# Patient Record
Sex: Male | Born: 1975 | Race: Black or African American | Hispanic: No | Marital: Married | Smoking: Former smoker
Health system: Southern US, Community
[De-identification: ages and names within clinical notes are randomized; demographics above are authoritative.]

## PROBLEM LIST (undated history)

## (undated) DIAGNOSIS — G44009 Cluster headache syndrome, unspecified, not intractable: Secondary | ICD-10-CM

## (undated) DIAGNOSIS — N529 Male erectile dysfunction, unspecified: Secondary | ICD-10-CM

## (undated) DIAGNOSIS — I10 Essential (primary) hypertension: Secondary | ICD-10-CM

## (undated) DIAGNOSIS — M79673 Pain in unspecified foot: Secondary | ICD-10-CM

## (undated) DIAGNOSIS — E785 Hyperlipidemia, unspecified: Secondary | ICD-10-CM

## (undated) DIAGNOSIS — R079 Chest pain, unspecified: Secondary | ICD-10-CM

## (undated) HISTORY — DX: Cluster headache syndrome, unspecified, not intractable: G44.009

## (undated) HISTORY — DX: Male erectile dysfunction, unspecified: N52.9

## (undated) HISTORY — DX: Pain in unspecified foot: M79.673

## (undated) HISTORY — DX: Chest pain, unspecified: R07.9

## (undated) HISTORY — DX: Hyperlipidemia, unspecified: E78.5

---

## 1999-09-30 ENCOUNTER — Encounter: Payer: Self-pay | Admitting: Emergency Medicine

## 1999-09-30 ENCOUNTER — Emergency Department (HOSPITAL_COMMUNITY): Admission: EM | Admit: 1999-09-30 | Discharge: 1999-09-30 | Payer: Self-pay | Admitting: Emergency Medicine

## 2002-09-18 ENCOUNTER — Emergency Department (HOSPITAL_COMMUNITY): Admission: EM | Admit: 2002-09-18 | Discharge: 2002-09-19 | Payer: Self-pay | Admitting: Emergency Medicine

## 2005-11-11 ENCOUNTER — Emergency Department (HOSPITAL_COMMUNITY): Admission: EM | Admit: 2005-11-11 | Discharge: 2005-11-11 | Payer: Self-pay | Admitting: Family Medicine

## 2007-01-19 ENCOUNTER — Emergency Department (HOSPITAL_COMMUNITY): Admission: EM | Admit: 2007-01-19 | Discharge: 2007-01-19 | Payer: Self-pay | Admitting: Emergency Medicine

## 2008-01-17 ENCOUNTER — Emergency Department (HOSPITAL_COMMUNITY): Admission: EM | Admit: 2008-01-17 | Discharge: 2008-01-17 | Payer: Self-pay | Admitting: Emergency Medicine

## 2008-01-28 ENCOUNTER — Emergency Department (HOSPITAL_COMMUNITY): Admission: EM | Admit: 2008-01-28 | Discharge: 2008-01-29 | Payer: Self-pay | Admitting: Emergency Medicine

## 2010-03-27 ENCOUNTER — Emergency Department (HOSPITAL_COMMUNITY): Admission: EM | Admit: 2010-03-27 | Discharge: 2010-03-27 | Payer: Self-pay | Admitting: Emergency Medicine

## 2010-08-06 ENCOUNTER — Emergency Department (HOSPITAL_COMMUNITY)
Admission: EM | Admit: 2010-08-06 | Discharge: 2010-08-06 | Payer: Self-pay | Source: Home / Self Care | Admitting: Emergency Medicine

## 2010-11-23 ENCOUNTER — Emergency Department (HOSPITAL_COMMUNITY)
Admission: EM | Admit: 2010-11-23 | Discharge: 2010-11-24 | Disposition: A | Discharge status: Home / Self Care | Payer: Self-pay | Attending: Emergency Medicine | Admitting: Emergency Medicine

## 2010-11-23 DIAGNOSIS — R109 Unspecified abdominal pain: Secondary | ICD-10-CM | POA: Insufficient documentation

## 2010-11-23 DIAGNOSIS — R6883 Chills (without fever): Secondary | ICD-10-CM | POA: Insufficient documentation

## 2010-11-23 DIAGNOSIS — R197 Diarrhea, unspecified: Secondary | ICD-10-CM | POA: Insufficient documentation

## 2010-11-23 DIAGNOSIS — R110 Nausea: Secondary | ICD-10-CM | POA: Insufficient documentation

## 2010-11-23 LAB — URINALYSIS, ROUTINE W REFLEX MICROSCOPIC
Glucose, UA: NEGATIVE mg/dL
Hgb urine dipstick: NEGATIVE
Specific Gravity, Urine: 1.029 (ref 1.005–1.030)
pH: 5 (ref 5.0–8.0)

## 2010-11-24 ENCOUNTER — Emergency Department (HOSPITAL_COMMUNITY): Payer: Self-pay

## 2010-11-24 LAB — COMPREHENSIVE METABOLIC PANEL
ALT: <8 U/L (ref 0–53)
AST: 12 U/L (ref 0–37)
CO2: 24 mEq/L (ref 19–32)
Chloride: 111 mEq/L (ref 96–112)
Creatinine, Ser: 0.94 mg/dL (ref 0.4–1.5)
GFR calc Af Amer: >60 mL/min (ref >60)
GFR calc non Af Amer: >60 mL/min (ref >60)
Total Bilirubin: 1 mg/dL (ref 0.3–1.2)

## 2010-11-24 LAB — CBC
HCT: 44 % (ref 39.0–52.0)
Hemoglobin: 14.8 g/dL (ref 13.0–17.0)
MCH: 29.8 pg (ref 26.0–34.0)
MCHC: 33.6 g/dL (ref 30.0–36.0)
MCV: 88.5 fL (ref 78.0–100.0)
Platelets: 270 K/uL (ref 150–400)
RBC: 4.97 MIL/uL (ref 4.22–5.81)
RDW: 14.7 % (ref 11.5–15.5)
WBC: 6.1 K/uL (ref 4.0–10.5)

## 2010-11-24 LAB — DIFFERENTIAL
Basophils Absolute: 0 K/uL (ref 0.0–0.1)
Basophils Relative: 0 % (ref 0–1)
Eosinophils Absolute: 0.1 K/uL (ref 0.0–0.7)
Eosinophils Relative: 1 % (ref 0–5)
Lymphocytes Relative: 27 % (ref 12–46)
Lymphs Abs: 1.7 10*3/uL (ref 0.7–4.0)
Monocytes Absolute: 0.4 K/uL (ref 0.1–1.0)
Monocytes Relative: 6 % (ref 3–12)
Neutro Abs: 4 10*3/uL (ref 1.7–7.7)
Neutrophils Relative %: 65 % (ref 43–77)

## 2010-11-24 LAB — COMPREHENSIVE METABOLIC PANEL WITH GFR
Albumin: 3.9 g/dL (ref 3.5–5.2)
Alkaline Phosphatase: 65 U/L (ref 39–117)
BUN: 9 mg/dL (ref 6–23)
Calcium: 9.1 mg/dL (ref 8.4–10.5)
Glucose, Bld: 98 mg/dL (ref 70–99)
Potassium: 4.1 meq/L (ref 3.5–5.1)
Sodium: 139 meq/L (ref 135–145)
Total Protein: 6.9 g/dL (ref 6.0–8.3)

## 2010-11-24 LAB — LIPASE, BLOOD: Lipase: 23 U/L (ref 11–59)

## 2011-02-09 ENCOUNTER — Inpatient Hospital Stay (INDEPENDENT_AMBULATORY_CARE_PROVIDER_SITE_OTHER)
Admission: RE | Admit: 2011-02-09 | Discharge: 2011-02-09 | Disposition: A | Discharge status: Home / Self Care | Payer: Self-pay | Source: Ambulatory Visit | Attending: Family Medicine | Admitting: Family Medicine

## 2011-02-09 DIAGNOSIS — T148 Other injury of unspecified body region: Secondary | ICD-10-CM

## 2011-03-15 ENCOUNTER — Emergency Department (HOSPITAL_COMMUNITY): Payer: Self-pay

## 2011-03-15 ENCOUNTER — Emergency Department (HOSPITAL_COMMUNITY)
Admission: EM | Admit: 2011-03-15 | Discharge: 2011-03-15 | Disposition: A | Discharge status: Home / Self Care | Payer: Self-pay | Attending: Emergency Medicine | Admitting: Emergency Medicine

## 2011-03-15 DIAGNOSIS — R0789 Other chest pain: Secondary | ICD-10-CM | POA: Insufficient documentation

## 2011-03-15 DIAGNOSIS — R071 Chest pain on breathing: Secondary | ICD-10-CM | POA: Insufficient documentation

## 2011-03-15 DIAGNOSIS — R112 Nausea with vomiting, unspecified: Secondary | ICD-10-CM | POA: Insufficient documentation

## 2011-03-15 DIAGNOSIS — R197 Diarrhea, unspecified: Secondary | ICD-10-CM | POA: Insufficient documentation

## 2011-03-15 LAB — POCT I-STAT, CHEM 8
BUN: 10 mg/dL (ref 6–23)
Calcium, Ion: 1.22 mmol/L (ref 1.12–1.32)
Creatinine, Ser: 1.2 mg/dL (ref 0.50–1.35)
TCO2: 26 mmol/L (ref 0–100)

## 2011-03-15 LAB — POCT I-STAT TROPONIN I

## 2011-11-02 ENCOUNTER — Emergency Department (HOSPITAL_COMMUNITY)
Admission: EM | Admit: 2011-11-02 | Discharge: 2011-11-03 | Disposition: A | Discharge status: Home / Self Care | Payer: Self-pay | Attending: Emergency Medicine | Admitting: Emergency Medicine

## 2011-11-02 ENCOUNTER — Encounter (HOSPITAL_COMMUNITY): Payer: Self-pay | Admitting: Emergency Medicine

## 2011-11-02 DIAGNOSIS — K529 Noninfective gastroenteritis and colitis, unspecified: Secondary | ICD-10-CM

## 2011-11-02 DIAGNOSIS — K5289 Other specified noninfective gastroenteritis and colitis: Secondary | ICD-10-CM | POA: Insufficient documentation

## 2011-11-02 DIAGNOSIS — K219 Gastro-esophageal reflux disease without esophagitis: Secondary | ICD-10-CM | POA: Insufficient documentation

## 2011-11-02 LAB — CBC
Hemoglobin: 14.7 g/dL (ref 13.0–17.0)
MCH: 31.3 pg (ref 26.0–34.0)
MCV: 89.8 fL (ref 78.0–100.0)
RBC: 4.69 MIL/uL (ref 4.22–5.81)

## 2011-11-02 LAB — BASIC METABOLIC PANEL
BUN: 7 mg/dL (ref 6–23)
Calcium: 9 mg/dL (ref 8.4–10.5)
GFR calc non Af Amer: >90 mL/min (ref >90)
Glucose, Bld: 89 mg/dL (ref 70–99)
Potassium: 3.8 mEq/L (ref 3.5–5.1)

## 2011-11-02 LAB — DIFFERENTIAL
Eosinophils Absolute: 0.2 10*3/uL (ref 0.0–0.7)
Eosinophils Relative: 3 % (ref 0–5)
Lymphs Abs: 2.8 10*3/uL (ref 0.7–4.0)
Monocytes Relative: 6 % (ref 3–12)

## 2011-11-02 NOTE — ED Notes (Signed)
PT. REPORTS VOMITTING AND DIARRHEA WITH MID ABDOMINAL CRAMPING ONSET LAST Saturday , DENIES FEVER OR CHILLS.

## 2011-11-03 LAB — URINALYSIS, ROUTINE W REFLEX MICROSCOPIC
Hgb urine dipstick: NEGATIVE
Specific Gravity, Urine: 1.028 (ref 1.005–1.030)
pH: 6 (ref 5.0–8.0)

## 2011-11-03 MED ORDER — PANTOPRAZOLE SODIUM 40 MG PO TBEC: 40.0000 mg | DELAYED_RELEASE_TABLET | Freq: Every day | ORAL | Status: DC

## 2011-11-03 MED ORDER — PANTOPRAZOLE SODIUM 40 MG PO TBEC
40.0000 mg | DELAYED_RELEASE_TABLET | Freq: Once | ORAL | Status: AC
  Administered 2011-11-03: 40 mg via ORAL
  Filled 2011-11-03: qty 1

## 2011-11-03 MED ORDER — GI COCKTAIL ~~LOC~~
30.0000 mL | Freq: Once | ORAL | Status: AC
  Administered 2011-11-03: 30 mL via ORAL
  Filled 2011-11-03: qty 30

## 2011-11-03 NOTE — ED Provider Notes (Signed)
History     CSN: [id number]  Arrival date & time 11/02/11  2218   First MD Initiated Contact with Patient 11/03/11 0145      Chief Complaint  Patient presents with  . Emesis    (Consider location/radiation/quality/duration/timing/severity/associated sxs/prior treatment) Patient is a 36 y.o. male presenting with vomiting. The history is provided by the patient.  Emesis   He started getting sick 2 and half days ago with nausea and vomiting and diarrhea. There is associated mild epigastric cramping which did improve after he vomited or had a bowel movement. He had chills and sweats without any fever. He vomited once 2 days ago and once yesterday. Today he is not having any further nausea. He's only had one bowel movement today and no longer has a sense that he is going to have more diarrhea. He is complaining of some ongoing burning and cramping in his epigastric area without radiation. He has taken antacids on a couple of occasions with temporary relief. He has had sick contacts who had similar stomach, earlier in the week. Symptoms are described as moderate to severe.  History reviewed. No pertinent past medical history.  History reviewed. No pertinent past surgical history.  No family history on file.  History  Substance Use Topics  . Smoking status: Never Smoker   . Smokeless tobacco: Not on file  . Alcohol Use: Yes      Review of Systems  Gastrointestinal: Positive for vomiting.  All other systems reviewed and are negative.    Allergies  Review of patient's allergies indicates no known allergies.  Home Medications   Current Outpatient Rx  Name Route Sig Dispense Refill  . OVER THE COUNTER MEDICATION Oral Take 15 mLs by mouth daily as needed. For upset stomach (Generic Antacid)      BP 132/74  Pulse 81  Temp(Src) 98.6 F (37 C) (Oral)  Resp 17  SpO2 96%  Physical Exam  Nursing note and vitals reviewed.  36 year old male who is resting comfortably and in no  acute distress. Vital signs are normal. Oxygen saturation is 96% which is normal. Head is normocephalic and atraumatic. PERRLA, EOMI. Oropharynx is clear. Mucous members are moist. Neck is nontender and supple without adenopathy or JVD. Lungs are clear without rales, wheezes, rhonchi. Heart has regular rate and rhythm without murmur. Abdomen is soft, flat, without masses. There is mild epigastric tenderness. There is no rebound or guarding. Peristalsis is diminished but present. Extremities have no cyanosis or edema, full range of motion is present. Skin is warm and dry without rash. Neurologic: Mental status is normal, cranial nerves are intact, there are no focal motor or sensory deficits.  ED Course  Procedures (including critical care time)  Results for orders placed during the hospital encounter of 11/02/11  URINALYSIS, ROUTINE W REFLEX MICROSCOPIC      Component Value Range   Color, Urine YELLOW  YELLOW    APPearance CLEAR  CLEAR    Specific Gravity, Urine 1.028  1.005 - 1.030    pH 6.0  5.0 - 8.0    Glucose, UA NEGATIVE  NEGATIVE (mg/dL)   Hgb urine dipstick NEGATIVE  NEGATIVE    Bilirubin Urine SMALL (*) NEGATIVE    Ketones, ur 15 (*) NEGATIVE (mg/dL)   Protein, ur NEGATIVE  NEGATIVE (mg/dL)   Urobilinogen, UA 1.0  0.0 - 1.0 (mg/dL)   Nitrite NEGATIVE  NEGATIVE    Leukocytes, UA NEGATIVE  NEGATIVE   CBC  Component Value Range   WBC 5.9  4.0 - 10.5 (K/uL)   RBC 4.69  4.22 - 5.81 (MIL/uL)   Hemoglobin 14.7  13.0 - 17.0 (g/dL)   HCT 16.1  09.6 - 04.5 (%)   MCV 89.8  78.0 - 100.0 (fL)   MCH 31.3  26.0 - 34.0 (pg)   MCHC 34.9  30.0 - 36.0 (g/dL)   RDW 40.9  81.1 - 91.4 (%)   Platelets 242  150 - 400 (K/uL)  DIFFERENTIAL      Component Value Range   Neutrophils Relative 43  43 - 77 (%)   Neutro Abs 2.5  1.7 - 7.7 (K/uL)   Lymphocytes Relative 47 (*) 12 - 46 (%)   Lymphs Abs 2.8  0.7 - 4.0 (K/uL)   Monocytes Relative 6  3 - 12 (%)   Monocytes Absolute 0.4  0.1 - 1.0 (K/uL)     Eosinophils Relative 3  0 - 5 (%)   Eosinophils Absolute 0.2  0.0 - 0.7 (K/uL)   Basophils Relative 1  0 - 1 (%)   Basophils Absolute 0.0  0.0 - 0.1 (K/uL)  BASIC METABOLIC PANEL      Component Value Range   Sodium 142  135 - 145 (mEq/L)   Potassium 3.8  3.5 - 5.1 (mEq/L)   Chloride 106  96 - 112 (mEq/L)   CO2 27  19 - 32 (mEq/L)   Glucose, Bld 89  70 - 99 (mg/dL)   BUN 7  6 - 23 (mg/dL)   Creatinine, Ser 7.82  0.50 - 1.35 (mg/dL)   Calcium 9.0  8.4 - 95.6 (mg/dL)   GFR calc non Af Amer >90  >90 (mL/min)   GFR calc Af Amer >90  >90 (mL/min)   He feels much better after a GI cocktail and oral Protonix. He'll be sent home with a prescription for Protonix and instructed use antacids as needed.  1. Gastroenteritis   2. GERD (gastroesophageal reflux disease)       MDM  Oral gastroenteritis. He is very much over the nausea and diarrhea component. Right now he is having dyspepsia and will be given a therapeutic trial of a GI cocktail and proton pump inhibitor.        Dione Booze, MD 11/03/11 [phone]

## 2011-11-03 NOTE — Discharge Instructions (Signed)
Gastroesophageal Reflux Disease, Adult Gastroesophageal reflux disease (GERD) happens when acid from your stomach flows up into the esophagus. When acid comes in contact with the esophagus, the acid causes soreness (inflammation) in the esophagus. Over time, GERD may create small holes (ulcers) in the lining of the esophagus. CAUSES   Increased body weight. This puts pressure on the stomach, making acid rise from the stomach into the esophagus.   Smoking. This increases acid production in the stomach.   Drinking alcohol. This causes decreased pressure in the lower esophageal sphincter (valve or ring of muscle between the esophagus and stomach), allowing acid from the stomach into the esophagus.   Late evening meals and a full stomach. This increases pressure and acid production in the stomach.   A malformed lower esophageal sphincter.  Sometimes, no cause is found. SYMPTOMS   Burning pain in the lower part of the mid-chest behind the breastbone and in the mid-stomach area. This may occur twice a week or more often.   Trouble swallowing.   Sore throat.   Dry cough.   Asthma-like symptoms including chest tightness, shortness of breath, or wheezing.  DIAGNOSIS  Your caregiver may be able to diagnose GERD based on your symptoms. In some cases, X-rays and other tests may be done to check for complications or to check the condition of your stomach and esophagus. TREATMENT  Your caregiver may recommend over-the-counter or prescription medicines to help decrease acid production. Ask your caregiver before starting or adding any new medicines.  HOME CARE INSTRUCTIONS   Change the factors that you can control. Ask your caregiver for guidance concerning weight loss, quitting smoking, and alcohol consumption.   Avoid foods and drinks that make your symptoms worse, such as:   Caffeine or alcoholic drinks.   Chocolate.   Peppermint or mint flavorings.   Garlic and onions.   Spicy foods.     Citrus fruits, such as oranges, lemons, or limes.   Tomato-based foods such as sauce, chili, salsa, and pizza.   Fried and fatty foods.   Avoid lying down for the 3 hours prior to your bedtime or prior to taking a nap.   Eat small, frequent meals instead of large meals.   Wear loose-fitting clothing. Do not wear anything tight around your waist that causes pressure on your stomach.   Raise the head of your bed 6 to 8 inches with wood blocks to help you sleep. Extra pillows will not help.   Only take over-the-counter or prescription medicines for pain, discomfort, or fever as directed by your caregiver.   Do not take aspirin, ibuprofen, or other nonsteroidal anti-inflammatory drugs (NSAIDs).  SEEK IMMEDIATE MEDICAL CARE IF:   You have pain in your arms, neck, jaw, teeth, or back.   Your pain increases or changes in intensity or duration.   You develop nausea, vomiting, or sweating (diaphoresis).   You develop shortness of breath, or you faint.   Your vomit is green, yellow, black, or looks like coffee grounds or blood.   Your stool is red, bloody, or black.  These symptoms could be signs of other problems, such as heart disease, gastric bleeding, or esophageal bleeding. MAKE SURE YOU:   Understand these instructions.   Will watch your condition.   Will get help right away if you are not doing well or get worse.  Document Released: 04/29/2005 Document Revised: 07/09/2011 Document Reviewed: 02/06/2011 [facility] Patient Information 2012 [place], [place].  Pantoprazole tablets What is this medicine? PANTOPRAZOLE (pan  TOE pra zole) prevents the production of acid in the stomach. It is used to treat gastroesophageal reflux disease (GERD), inflammation of the esophagus, and Zollinger-Ellison syndrome. This medicine may be used for other purposes; ask your health care provider or pharmacist if you have questions. What should I tell my health care provider before I take this  medicine? They need to know if you have any of these conditions: -liver disease -low levels of magnesium in the blood -an unusual or allergic reaction to omeprazole, lansoprazole, pantoprazole, rabeprazole, other medicines, foods, dyes, or preservatives -pregnant or trying to get pregnant -breast-feeding How should I use this medicine? Take this medicine by mouth. Swallow the tablets whole with a drink of water. Follow the directions on the prescription label. Do not crush, break, or chew. Take your medicine at regular intervals. Do not take your medicine more often than directed. Talk to your pediatrician regarding the use of this medicine in children. While this drug may be prescribed for children as young as 5 years for selected conditions, precautions do apply. Overdosage: If you think you have taken too much of this medicine contact a poison control center or emergency room at once. NOTE: This medicine is only for you. Do not share this medicine with others. What if I miss a dose? If you miss a dose, take it as soon as you can. If it is almost time for your next dose, take only that dose. Do not take double or extra doses. What may interact with this medicine? Do not take this medicine with any of the following medications: -atazanavir -nelfinavir This medicine may also interact with the following medications: -ampicillin -delavirdine -digoxin -diuretics -iron salts -medicines for fungal infections like ketoconazole, itraconazole and voriconazole -warfarin This list may not describe all possible interactions. Give your health care provider a list of all the medicines, herbs, non-prescription drugs, or dietary supplements you use. Also tell them if you smoke, drink alcohol, or use illegal drugs. Some items may interact with your medicine. What should I watch for while using this medicine? It can take several days before your stomach pain gets better. Check with your doctor or health  care professional if your condition does not start to get better, or if it gets worse. You may need blood work done while you are taking this medicine. What side effects may I notice from receiving this medicine? Side effects that you should report to your doctor or health care professional as soon as possible: -allergic reactions like skin rash, itching or hives, swelling of the face, lips, or tongue -bone, muscle or joint pain -breathing problems -chest pain or chest tightness -dark yellow or brown urine -dizziness -fast, irregular heartbeat -feeling faint or lightheaded -fever or sore throat -muscle spasm -palpitations -redness, blistering, peeling or loosening of the skin, including inside the mouth -seizures -tremors -unusual bleeding or bruising -unusually weak or tired -yellowing of the eyes or skin Side effects that usually do not require medical attention (Report these to your doctor or health care professional if they continue or are bothersome.): -constipation -diarrhea -dry mouth -headache -nausea This list may not describe all possible side effects. Call your doctor for medical advice about side effects. You may report side effects to FDA at 1-800-FDA-1088. Where should I keep my medicine? Keep out of the reach of children. Store at room temperature between 15 and 30 degrees C (59 and 86 degrees F). Protect from light and moisture. Throw away any unused medicine after the  expiration date. NOTE: This sheet is a summary. It may not cover all possible information. If you have questions about this medicine, talk to your doctor, pharmacist, or health care provider.  2012, [employer]. (10/08/2009 12:03:53 PM)

## 2012-08-24 ENCOUNTER — Emergency Department (HOSPITAL_COMMUNITY)
Admission: EM | Admit: 2012-08-24 | Discharge: 2012-08-24 | Disposition: A | Discharge status: Home / Self Care | Payer: Self-pay | Attending: Emergency Medicine | Admitting: Emergency Medicine

## 2012-08-24 ENCOUNTER — Encounter (HOSPITAL_COMMUNITY): Payer: Self-pay | Admitting: Emergency Medicine

## 2012-08-24 DIAGNOSIS — B349 Viral infection, unspecified: Secondary | ICD-10-CM

## 2012-08-24 DIAGNOSIS — M549 Dorsalgia, unspecified: Secondary | ICD-10-CM | POA: Insufficient documentation

## 2012-08-24 DIAGNOSIS — B9789 Other viral agents as the cause of diseases classified elsewhere: Secondary | ICD-10-CM | POA: Insufficient documentation

## 2012-08-24 DIAGNOSIS — Z87891 Personal history of nicotine dependence: Secondary | ICD-10-CM | POA: Insufficient documentation

## 2012-08-24 DIAGNOSIS — J329 Chronic sinusitis, unspecified: Secondary | ICD-10-CM | POA: Insufficient documentation

## 2012-08-24 DIAGNOSIS — R112 Nausea with vomiting, unspecified: Secondary | ICD-10-CM | POA: Insufficient documentation

## 2012-08-24 MED ORDER — LACTULOSE 10 GM/15ML PO SOLN: 30.0000 g | Freq: Once | ORAL | Status: DC

## 2012-08-24 MED ORDER — HYDROCODONE-ACETAMINOPHEN 5-325 MG PO TABS: 2.0000 | ORAL_TABLET | ORAL | Status: DC | PRN

## 2012-08-24 MED ORDER — MORPHINE SULFATE 4 MG/ML IJ SOLN
4.0000 mg | Freq: Once | INTRAMUSCULAR | Status: AC
  Administered 2012-08-24: 4 mg via INTRAMUSCULAR
  Filled 2012-08-24: qty 1

## 2012-08-24 MED ORDER — OSELTAMIVIR PHOSPHATE 75 MG PO CAPS: 75.0000 mg | ORAL_CAPSULE | Freq: Two times a day (BID) | ORAL | Status: DC

## 2012-08-24 MED ORDER — KETOROLAC TROMETHAMINE 30 MG/ML IJ SOLN
60.0000 mg | Freq: Once | INTRAMUSCULAR | Status: AC
  Administered 2012-08-24: 60 mg via INTRAMUSCULAR
  Filled 2012-08-24: qty 2

## 2012-08-24 MED ORDER — SULFAMETHOXAZOLE-TRIMETHOPRIM 800-160 MG PO TABS: 1.0000 | ORAL_TABLET | Freq: Two times a day (BID) | ORAL | Status: AC

## 2012-08-24 MED ORDER — NAPROXEN 375 MG PO TABS: 375.0000 mg | ORAL_TABLET | Freq: Two times a day (BID) | ORAL | Status: DC

## 2012-08-24 MED ORDER — ACETAMINOPHEN 325 MG PO TABS
650.0000 mg | ORAL_TABLET | Freq: Once | ORAL | Status: AC
  Administered 2012-08-24: 650 mg via ORAL
  Filled 2012-08-24: qty 2

## 2012-08-24 NOTE — ED Provider Notes (Signed)
History     CSN: [id number]  Arrival date & time 08/24/12  4098   First MD Initiated Contact with Patient 08/24/12 0542      Chief Complaint  Patient presents with  . Headache    patient states from sinus congestion  . Back Pain  . Nausea  . Emesis    (Consider location/radiation/quality/duration/timing/severity/associated sxs/prior treatment) Patient is a 37 y.o. male presenting with headaches, back pain, and vomiting. The history is provided by the patient.  Headache  This is a new problem. The current episode started yesterday. The problem occurs constantly. The problem has not changed since onset.The headache is associated with nothing. The pain is located in the bilateral region. The quality of the pain is described as dull. The pain is at a severity of 4/10. The pain is mild. Associated symptoms include vomiting.  Back Pain  Associated symptoms include headaches.  Emesis  Associated symptoms include headaches.    History reviewed. No pertinent past medical history.  History reviewed. No pertinent past surgical history.  No family history on file.  History  Substance Use Topics  . Smoking status: Former Games developer  . Smokeless tobacco: Not on file  . Alcohol Use: Yes      Review of Systems  Gastrointestinal: Positive for vomiting.  Musculoskeletal: Positive for back pain.  Neurological: Positive for headaches.  All other systems reviewed and are negative.    Allergies  Review of patient's allergies indicates no known allergies.  Home Medications   Current Outpatient Rx  Name  Route  Sig  Dispense  Refill  . OVER THE COUNTER MEDICATION   Oral   Take 15 mLs by mouth daily as needed. For upset stomach (Generic Antacid)         . PANTOPRAZOLE SODIUM 40 MG PO TBEC   Oral   Take 1 tablet (40 mg total) by mouth daily.   15 tablet   0     BP 136/81  Pulse 107  Temp 101.7 F (38.7 C) (Oral)  Resp 18  Ht 6' (1.829 m)  Wt 250 lb (113.399 kg)  BMI  33.91 kg/m2  SpO2 99%  Physical Exam  Constitutional: He is oriented to person, place, and time. He appears well-developed and well-nourished.  HENT:  Head: Normocephalic and atraumatic.  Eyes: Conjunctivae normal are normal. Pupils are equal, round, and reactive to light.  Neck: Normal range of motion. Neck supple. Spinous process tenderness and muscular tenderness present. No Brudzinski's sign and no Kernig's sign noted.  Cardiovascular: Normal rate, regular rhythm, normal heart sounds and intact distal pulses.   Pulmonary/Chest: Effort normal and breath sounds normal.  Abdominal: Soft. Bowel sounds are normal.  Neurological: He is alert and oriented to person, place, and time.  Skin: Skin is warm and dry.  Psychiatric: He has a normal mood and affect. His behavior is normal. Judgment and thought content normal.    ED Course  Procedures (including critical care time)  Labs Reviewed - No data to display No results found.   No diagnosis found.    MDM  mll viral syndrome.  No meningusmus.  Will analgesia,  Antiviral,  Dc to fu.  Ret new/worsneing sxs        Simaya Lumadue Lytle Michaels, MD 08/24/12 0600

## 2012-08-24 NOTE — ED Notes (Signed)
Patient claims started getting congested Monday night.  Patient claims that he started throwing up last night.   Patient claims that he has a bad headache with sinus congestion.  Patient complains of cough (dry).

## 2014-01-30 ENCOUNTER — Encounter (HOSPITAL_COMMUNITY): Payer: Self-pay | Admitting: Emergency Medicine

## 2014-01-30 ENCOUNTER — Emergency Department (INDEPENDENT_AMBULATORY_CARE_PROVIDER_SITE_OTHER)
Admission: EM | Admit: 2014-01-30 | Discharge: 2014-01-30 | Disposition: A | Discharge status: Home / Self Care | Payer: Self-pay | Source: Home / Self Care | Attending: Family Medicine | Admitting: Family Medicine

## 2014-01-30 DIAGNOSIS — K0889 Other specified disorders of teeth and supporting structures: Secondary | ICD-10-CM

## 2014-01-30 DIAGNOSIS — K089 Disorder of teeth and supporting structures, unspecified: Secondary | ICD-10-CM

## 2014-01-30 MED ORDER — IBUPROFEN 800 MG PO TABS
ORAL_TABLET | ORAL | Status: AC
  Filled 2014-01-30: qty 1

## 2014-01-30 MED ORDER — CLINDAMYCIN HCL 300 MG PO CAPS: 300.0000 mg | ORAL_CAPSULE | Freq: Three times a day (TID) | ORAL | Status: DC

## 2014-01-30 MED ORDER — IBUPROFEN 800 MG PO TABS
800.0000 mg | ORAL_TABLET | Freq: Once | ORAL | Status: AC
  Administered 2014-01-30: 800 mg via ORAL

## 2014-01-30 MED ORDER — DICLOFENAC POTASSIUM 50 MG PO TABS: 50.0000 mg | ORAL_TABLET | Freq: Three times a day (TID) | ORAL | Status: DC

## 2014-01-30 NOTE — Discharge Instructions (Signed)
Take medicine as prescribed, see your dentist as soon as possible °
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
## 2014-01-30 NOTE — ED Provider Notes (Signed)
CSN: [id number]     Arrival date & time 01/30/14  0820 History   First MD Initiated Contact with Patient 01/30/14 [phone]     Chief Complaint  Patient presents with  . Dental Pain   (Consider location/radiation/quality/duration/timing/severity/associated sxs/prior Treatment) Patient is a 38 y.o. male presenting with tooth pain. The history is provided by the patient.  Dental Pain Location:  Upper Upper teeth location:  2/RU 2nd molar Quality:  Aching and throbbing Severity:  Moderate Duration:  2 days Progression:  Worsening Chronicity:  New Context: abscess and dental fracture   Associated symptoms: facial pain and facial swelling   Associated symptoms: no fever   Risk factors: lack of dental care     History reviewed. No pertinent past medical history. History reviewed. No pertinent past surgical history. History reviewed. No pertinent family history. History  Substance Use Topics  . Smoking status: Former Research scientist (life sciences)  . Smokeless tobacco: Not on file  . Alcohol Use: Yes    Review of Systems  Constitutional: Negative.  Negative for fever.  HENT: Positive for dental problem and facial swelling. Negative for ear pain.     Allergies  Review of patient's allergies indicates no known allergies.  Home Medications   Prior to Admission medications   Medication Sig Start Date End Date Taking? Authorizing Samantha Ragen  clindamycin (CLEOCIN) 300 MG capsule Take 1 capsule (300 mg total) by mouth 3 (three) times daily. 01/30/14   Billy Fischer, MD  diclofenac (CATAFLAM) 50 MG tablet Take 1 tablet (50 mg total) by mouth 3 (three) times daily. For tooth pain 01/30/14   Billy Fischer, MD  HYDROcodone-acetaminophen (NORCO/VICODIN) 5-325 MG per tablet Take 2 tablets by mouth every 4 (four) hours as needed for pain. 08/24/12   Chionesu Ferne Reus, MD  naproxen (NAPROSYN) 375 MG tablet Take 1 tablet (375 mg total) by mouth 2 (two) times daily. 08/24/12   Chionesu Ferne Reus, MD  oseltamivir (TAMIFLU) 75  MG capsule Take 1 capsule (75 mg total) by mouth every 12 (twelve) hours. 08/24/12   Chionesu K Wetzel Bjornstad, MD   BP 159/113  Pulse 84  Temp(Src) 97.8 F (36.6 C) (Oral)  Resp 16  SpO2 95% Physical Exam  Nursing note and vitals reviewed. Constitutional: He is oriented to person, place, and time. He appears well-developed and well-nourished. He appears distressed.  HENT:  Head: Normocephalic.  Right Ear: External ear normal.  Left Ear: External ear normal.  Mouth/Throat: Oropharynx is clear and moist. Abnormal dentition.    Right facial sts and tenderness  Neck: Normal range of motion.  Neurological: He is alert and oriented to person, place, and time.  Skin: Skin is warm and dry.    ED Course  Procedures (including critical care time) Labs Review Labs Reviewed - No data to display  Imaging Review No results found.   MDM   1. Pain, dental        Billy Fischer, MD 01/30/14 [phone]

## 2015-06-17 ENCOUNTER — Emergency Department (INDEPENDENT_AMBULATORY_CARE_PROVIDER_SITE_OTHER)
Admission: EM | Admit: 2015-06-17 | Discharge: 2015-06-17 | Disposition: A | Discharge status: Home / Self Care | Payer: Self-pay | Source: Home / Self Care | Attending: Family Medicine | Admitting: Family Medicine

## 2015-06-17 ENCOUNTER — Encounter (HOSPITAL_COMMUNITY): Payer: Self-pay | Admitting: Emergency Medicine

## 2015-06-17 DIAGNOSIS — S46812A Strain of other muscles, fascia and tendons at shoulder and upper arm level, left arm, initial encounter: Secondary | ICD-10-CM

## 2015-06-17 DIAGNOSIS — S161XXA Strain of muscle, fascia and tendon at neck level, initial encounter: Secondary | ICD-10-CM

## 2015-06-17 DIAGNOSIS — T148 Other injury of unspecified body region: Secondary | ICD-10-CM

## 2015-06-17 DIAGNOSIS — T148XXA Other injury of unspecified body region, initial encounter: Secondary | ICD-10-CM

## 2015-06-17 MED ORDER — TRAMADOL HCL 50 MG PO TABS: 50.0000 mg | ORAL_TABLET | Freq: Four times a day (QID) | ORAL | Status: DC | PRN

## 2015-06-17 MED ORDER — DICLOFENAC POTASSIUM 50 MG PO TABS: 50.0000 mg | ORAL_TABLET | Freq: Three times a day (TID) | ORAL | Status: DC
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
## 2016-06-27 ENCOUNTER — Emergency Department (HOSPITAL_COMMUNITY)
Admission: EM | Admit: 2016-06-27 | Discharge: 2016-06-27 | Disposition: A | Discharge status: Home / Self Care | Payer: Self-pay | Attending: Emergency Medicine | Admitting: Emergency Medicine

## 2016-06-27 ENCOUNTER — Encounter (HOSPITAL_COMMUNITY): Payer: Self-pay | Admitting: *Deleted

## 2016-06-27 DIAGNOSIS — Y929 Unspecified place or not applicable: Secondary | ICD-10-CM | POA: Insufficient documentation

## 2016-06-27 DIAGNOSIS — S0502XA Injury of conjunctiva and corneal abrasion without foreign body, left eye, initial encounter: Secondary | ICD-10-CM

## 2016-06-27 DIAGNOSIS — T1592XA Foreign body on external eye, part unspecified, left eye, initial encounter: Secondary | ICD-10-CM | POA: Insufficient documentation

## 2016-06-27 DIAGNOSIS — X58XXXA Exposure to other specified factors, initial encounter: Secondary | ICD-10-CM | POA: Insufficient documentation

## 2016-06-27 DIAGNOSIS — Y939 Activity, unspecified: Secondary | ICD-10-CM | POA: Insufficient documentation

## 2016-06-27 DIAGNOSIS — Y999 Unspecified external cause status: Secondary | ICD-10-CM | POA: Insufficient documentation

## 2016-06-27 MED ORDER — FLUORESCEIN SODIUM 1 MG OP STRP
1.0000 | ORAL_STRIP | Freq: Once | OPHTHALMIC | Status: AC
  Administered 2016-06-27: 1 via OPHTHALMIC
  Filled 2016-06-27: qty 1

## 2016-06-27 MED ORDER — ERYTHROMYCIN 5 MG/GM OP OINT
1.0000 "application " | TOPICAL_OINTMENT | Freq: Once | OPHTHALMIC | Status: AC
  Administered 2016-06-27: 1 via OPHTHALMIC
  Filled 2016-06-27: qty 3.5

## 2016-06-27 MED ORDER — TETRACAINE HCL 0.5 % OP SOLN
1.0000 [drp] | Freq: Once | OPHTHALMIC | Status: AC
  Administered 2016-06-27: 1 [drp] via OPHTHALMIC
  Filled 2016-06-27: qty 2

## 2016-06-27 NOTE — Discharge Instructions (Signed)
We removed the foreign body, eye lash, from the left eye. Use the antibiotic eye ointment 3 times a day. Call Dr. Zenia Resides office to schedule follow up. Take ibuprofen for pain and inflammation. Return here as needed.

## 2016-06-27 NOTE — ED Notes (Signed)
Pt verbalized understanding of d/c instructions and has no further questions. Pt stable and NAD. Pt given erythromycin drops to take home and verbalized understanding to use 3 times per day. Pt to follow up with eye dr Monday.

## 2016-06-27 NOTE — ED Triage Notes (Signed)
Pt reports left eye irritation, redness and swelling since yesterday with blurred vision. Denies injury to eye.

## 2016-06-27 NOTE — ED Provider Notes (Signed)
[facility] DEPT Provider Note   CSN: [id number] Arrival date & time: 06/27/16  1543  By signing my name below, I, [name], attest that this documentation has been prepared under the direction and in the presence of Debroah Baller, NP. Electronically Signed: [name], ED Scribe. 06/27/16. 5:02 PM.  History   Chief Complaint Chief Complaint  Patient presents with  . Eye Problem   The history is provided by the patient. No language interpreter was used.    HPI Comments: Larry Phillips is a 40 y.o. male who presents to the Emergency Department complaining of gradual onset and worsening, constant left eye pain onset yesterday. Pain began while pt was relaxing inside and started as irritation of the left eyelid. He has corrective lenses for reading and does not wear contact lenses. Pt denies hx of similar eye problems. Pt reports associated blurry vision, eye swelling, erythema. He denies eye discharge, foreign body in eye and fever.  History reviewed. No pertinent past medical history.  There are no active problems to display for this patient.   History reviewed. No pertinent surgical history.   Home Medications    Prior to Admission medications   Medication Sig Start Date End Date Taking? Authorizing Provider  diclofenac (CATAFLAM) 50 MG tablet Take 1 tablet (50 mg total) by mouth 3 (three) times daily. One tablet TID with food prn pain. 06/17/15   Janne Napoleon, NP  traMADol (ULTRAM) 50 MG tablet Take 1 tablet (50 mg total) by mouth every 6 (six) hours as needed for moderate pain. 06/17/15   Janne Napoleon, NP    Family History History reviewed. No pertinent family history.  Social History Social History  Substance Use Topics  . Smoking status: Former Research scientist (life sciences)  . Smokeless tobacco: Not on file  . Alcohol use Yes   Allergies   Patient has no known allergies.   Review of Systems Review of Systems  Constitutional: Negative for fever.  Eyes: Positive for pain, redness  and visual disturbance. Negative for discharge.  All other systems reviewed and are negative.  Physical Exam Updated Vital Signs BP 152/99 (BP Location: Left Arm)   Pulse 82   Temp 98.3 F (36.8 C) (Oral)   Resp 18   SpO2 100%   Physical Exam  Constitutional: He is oriented to person, place, and time. He appears well-developed and well-nourished. He is active. No distress.  HENT:  Head: Normocephalic and atraumatic.  Eyes: EOM are normal. Pupils are equal, round, and reactive to light. Lids are everted and swept, no foreign bodies found. Left eye exhibits discharge. Left conjunctiva is injected.  Slit lamp exam:      The left eye shows corneal abrasion and fluorescein uptake.    Corneal abrasion to the left cornea and bubble area to the left sclera.  Tetracaine Opth. Drop to left eye, eye lash removed using a sterile cotton swab.   Neck: Normal range of motion. Neck supple.  Cardiovascular: Normal rate.   Pulmonary/Chest: Effort normal. No respiratory distress.  Abdominal: He exhibits no distension.  Musculoskeletal: Normal range of motion.  Neurological: He is alert and oriented to person, place, and time.  Skin: Skin is warm and dry.  Psychiatric: He has a normal mood and affect. His behavior is normal.  Nursing note and vitals reviewed.  ED Treatments / Results  DIAGNOSTIC STUDIES: Oxygen Saturation is 100% on RA, normal by my interpretation.    COORDINATION OF CARE: 4:34 PM Discussed treatment plan with pt at  bedside which includes Tetracaine, fluorescein, and visual acuity screening and pt agreed to plan.  Labs (all labs ordered are listed, but only abnormal results are displayed) Labs Reviewed - No data to display Radiology No results found.  Procedures Procedures (including critical care time)  Medications Ordered in ED Medications  erythromycin ophthalmic ointment 1 application (not administered)  tetracaine (PONTOCAINE) 0.5 % ophthalmic solution 1 drop (1  drop Left Eye Given 06/27/16 1701)  fluorescein ophthalmic strip 1 strip (1 strip Left Eye Given 06/27/16 1701)     Initial Impression / Assessment and Plan / ED Course  I have reviewed the triage vital signs and the nursing notes.  Clinical Course   I personally performed the services described in this documentation, which was scribed in my presence. The recorded information has been reviewed and is accurate.   Final Clinical Impressions(s) / ED Diagnoses  40 y.o. male with left eye irritation and x 24 hours. Foreign body removed, erythromycin opth ointment and f/u with Dr. Katy Fitch for further evaluation of the abrasion and the fluid filled area. Patient agrees with plan.   Final diagnoses:  Abrasion of left cornea, initial encounter  Foreign body of left eye, initial encounter    New Prescriptions New Prescriptions   No medications on file     [facility], NP 06/27/16 [place], MD 06/28/16 1447

## 2019-03-08 ENCOUNTER — Emergency Department (HOSPITAL_COMMUNITY)
Admission: EM | Admit: 2019-03-08 | Discharge: 2019-03-08 | Disposition: A | Discharge status: Home / Self Care | Payer: Self-pay | Attending: Emergency Medicine | Admitting: Emergency Medicine

## 2019-03-08 ENCOUNTER — Other Ambulatory Visit: Payer: Self-pay

## 2019-03-08 DIAGNOSIS — L03115 Cellulitis of right lower limb: Secondary | ICD-10-CM | POA: Insufficient documentation

## 2019-03-08 DIAGNOSIS — Z87891 Personal history of nicotine dependence: Secondary | ICD-10-CM | POA: Insufficient documentation

## 2019-03-08 MED ORDER — CEPHALEXIN 500 MG PO CAPS: 500.0000 mg | ORAL_CAPSULE | Freq: Four times a day (QID) | ORAL | 0 refills | Status: DC

## 2019-03-08 NOTE — ED Provider Notes (Signed)
[facility] EMERGENCY DEPARTMENT Provider Note   CSN: [id number] Arrival date & time: 03/08/19  1650    History   Chief Complaint Chief Complaint  Patient presents with  . Wound Infection    HPI Larry Phillips is a 43 y.o. male.     HPI   Larry Phillips is a 43 y.o. male, patient with no pertinent past medical history, presenting to the ED with erythema and tenderness to the right foot rising over the last several days.  He notes he has had what he calls a mole on the right foot since childhood.  For most of his life it has been flat and nontender. The growth on the foot has been expanding for the last several months.  He states it has been there since childhood.  He sustained a scratch to the growth about a month ago and it has been bleeding intermittently since then.  He has not experienced any foot pain or swelling before the last several days.  Denies fever/chills, numbness, weakness, or any other complaints.    No past medical history on file.  There are no active problems to display for this patient.   No past surgical history on file.      Home Medications    Prior to Admission medications   Medication Sig Start Date End Date Taking? Authorizing Provider  cephALEXin (KEFLEX) 500 MG capsule Take 1 capsule (500 mg total) by mouth 4 (four) times daily. 03/08/19   Joy, Shawn C, PA-C  diclofenac (CATAFLAM) 50 MG tablet Take 1 tablet (50 mg total) by mouth 3 (three) times daily. One tablet TID with food prn pain. 06/17/15   Janne Napoleon, NP  traMADol (ULTRAM) 50 MG tablet Take 1 tablet (50 mg total) by mouth every 6 (six) hours as needed for moderate pain. 06/17/15   Janne Napoleon, NP    Family History No family history on file.  Social History Social History   Tobacco Use  . Smoking status: Former Smoker  Substance Use Topics  . Alcohol use: Yes  . Drug use: Yes    Types: Marijuana     Allergies   Patient has no known allergies.    Review of Systems Review of Systems  Constitutional: Negative for fever.  Skin: Positive for color change and wound.  Neurological: Negative for weakness and numbness.     Physical Exam Updated Vital Signs BP (!) 158/112 (BP Location: Right Arm)   Pulse 86   Temp 98.2 F (36.8 C) (Oral)   Resp 18   Ht 6' (1.829 m)   Wt 110.7 kg   SpO2 99%   BMI 33.09 kg/m   Physical Exam Vitals signs and nursing note reviewed.  Constitutional:      General: He is not in acute distress.    Appearance: He is well-developed. He is not diaphoretic.  HENT:     Head: Normocephalic and atraumatic.  Eyes:     Conjunctiva/sclera: Conjunctivae normal.  Neck:     Musculoskeletal: Neck supple.  Cardiovascular:     Rate and Rhythm: Normal rate and regular rhythm.  Pulmonary:     Effort: Pulmonary effort is normal.  Musculoskeletal:     Comments: Patient has an abnormal growth to the dorsum of the right foot that bleeds easily. He has a surrounding area of erythema and tenderness consistent with cellulitis. No pain, tenderness, or swelling in the right ankle or the joints of the toes.  No pain with  range of motion of the right ankle.  Skin:    General: Skin is warm and dry.     Coloration: Skin is not pale.  Neurological:     Mental Status: He is alert.     Comments: Sensation to light touch grossly intact in the right foot. Motor function adequately intact.  Psychiatric:        Behavior: Behavior normal.                 ED Treatments / Results  Labs (all labs ordered are listed, but only abnormal results are displayed) Labs Reviewed - No data to display  EKG None  Radiology No results found.  Procedures Procedures (including critical care time)  Medications Ordered in ED Medications - No data to display   Initial Impression / Assessment and Plan / ED Course  I have reviewed the triage vital signs and the nursing notes.  Pertinent labs & imaging results that  were available during my care of the patient were reviewed by me and considered in my medical decision making (see chart for details).        Patient presents with pain and erythema to the right foot over the last several days.  Appears consistent with cellulitis.  Antibiotic therapy initiated.  The patient was given instructions for home care as well as return precautions. Patient voices understanding of these instructions, accepts the plan, and is comfortable with discharge.  Growth on the right foot that has been evolving for the last several months is concerning to me for possible cancerous growth.  The importance of expert evaluation and biopsy was communicated to the patient and his significant other at the bedside.  Follow-up suggestions were given in the discharge paperwork.  Final Clinical Impressions(s) / ED Diagnoses   Final diagnoses:  Cellulitis of right lower extremity    ED Discharge Orders         Ordered    cephALEXin (KEFLEX) 500 MG capsule  4 times daily     03/08/19 1934           Lorayne Bender, PA-C 03/08/19 1941    Deno Etienne, DO 03/08/19 2001

## 2019-03-08 NOTE — ED Triage Notes (Signed)
Pt has a mole on top of right foot.  Onset yesterday dog scratched mole.  Redness on skin around mole, mole bleeding from base, painful to touch  Mole measures 1 cm x 1 cm.

## 2019-03-08 NOTE — Discharge Instructions (Signed)
There is evidence of cellulitis, which is an infection in the skin. Please take all of your antibiotics until finished!   You may develop abdominal discomfort or diarrhea from the antibiotic.  You may help offset this with probiotics which you can buy or get in yogurt. Do not eat or take the probiotics until 2 hours after your antibiotic.   Antiinflammatory medications: Take 600 mg of ibuprofen every 6 hours or 440 mg (over the counter dose) to 500 mg (prescription dose) of naproxen every 12 hours for the next 3 days. After this time, these medications may be used as needed for pain. Take these medications with food to avoid upset stomach. Choose only one of these medications, do not take them together. Acetaminophen (generic for Tylenol): Should you continue to have additional pain while taking the ibuprofen or naproxen, you may add in acetaminophen as needed. Your daily total maximum amount of acetaminophen from all sources should be limited to 4000mg /day for persons without liver problems, or 2000mg /day for those with liver problems.  Follow-up: May follow-up with a primary care provider for any further management of the cellulitis.  The growth on the foot is a different concern.  It is very important that this be examined by a specialist.  It is likely that it will need to be biopsied and sent for testing.

## 2019-07-11 ENCOUNTER — Ambulatory Visit (INDEPENDENT_AMBULATORY_CARE_PROVIDER_SITE_OTHER): Payer: Self-pay | Admitting: Family Medicine

## 2019-07-11 ENCOUNTER — Other Ambulatory Visit: Payer: Self-pay | Admitting: Family Medicine

## 2019-07-11 ENCOUNTER — Other Ambulatory Visit: Payer: Self-pay

## 2019-07-11 VITALS — BP 140/80 | HR 81 | Wt 259.6 lb

## 2019-07-11 DIAGNOSIS — Z0000 Encounter for general adult medical examination without abnormal findings: Secondary | ICD-10-CM

## 2019-07-11 DIAGNOSIS — D229 Melanocytic nevi, unspecified: Secondary | ICD-10-CM

## 2019-07-11 LAB — POCT GLYCOSYLATED HEMOGLOBIN (HGB A1C): Hemoglobin A1C: 5.2 % (ref 4.0–5.6)

## 2019-07-11 NOTE — Patient Instructions (Signed)
It was nice to meet you today,  I will let you know the results of your lab work when I get them.  The pathology report on the mole will take longer than the other lab work but sometimes this takes several weeks.  If we need to do any sort of additional excision, I will let you know.  If you are trying to lose weight, the best way to do so is exercise and diet.  Try to exercise at least 30 minutes a day 5 days a week.  For controlling your diet you should try and aim for a goal of 2000 cal a day.  You can use apps and websites to help calculate have any calories or taken in during the day.  The best way to trim off excess calories is to not drink anything with calories in it such as sodas or juice, and limit your snacking in between breakfast, lunch, dinner.  You should follow up again in 3 months to examine your foot to make sure he has not continued to grow.  Have a great day,  Clemetine Marker, MD
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
## 2019-07-12 LAB — BASIC METABOLIC PANEL
BUN/Creatinine Ratio: 7 — ABNORMAL LOW (ref 9–20)
BUN: 8 mg/dL (ref 6–24)
CO2: 24 mmol/L (ref 20–29)
Calcium: 9.6 mg/dL (ref 8.7–10.2)
Chloride: 105 mmol/L (ref 96–106)
Creatinine, Ser: 1.12 mg/dL (ref 0.76–1.27)
GFR calc Af Amer: 94 mL/min/{1.73_m2} (ref >59)
GFR calc non Af Amer: 81 mL/min/{1.73_m2} (ref >59)
Glucose: 85 mg/dL (ref 65–99)
Potassium: 4.2 mmol/L (ref 3.5–5.2)
Sodium: 143 mmol/L (ref 134–144)

## 2019-07-12 LAB — HEPATITIS C ANTIBODY: Hep C Virus Ab: <0.1 s/co ratio (ref 0.0–0.9)

## 2019-07-12 LAB — HIV ANTIBODY (ROUTINE TESTING W REFLEX): HIV Screen 4th Generation wRfx: NONREACTIVE

## 2019-07-14 ENCOUNTER — Encounter: Payer: Self-pay | Admitting: Family Medicine

## 2019-07-14 DIAGNOSIS — Z0000 Encounter for general adult medical examination without abnormal findings: Secondary | ICD-10-CM | POA: Insufficient documentation

## 2019-07-14 DIAGNOSIS — D229 Melanocytic nevi, unspecified: Secondary | ICD-10-CM | POA: Insufficient documentation
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
## 2019-07-14 NOTE — Assessment & Plan Note (Addendum)
Denies any significant past medical history.  His previous "depression" does not meet the criteria for major depressive disorder based on his history given that it never last more than a week.  No current thoughts of hurting himself.  Does not feel "depressed" now.  PHQ 2 was normal. -Continue to monitor for signs of depression. -Hep C and HIV screening -BMP -A1c due to obesity and age

## 2019-07-18 ENCOUNTER — Encounter: Payer: Self-pay | Admitting: Family Medicine

## 2019-07-24 ENCOUNTER — Telehealth: Payer: Self-pay | Admitting: Family Medicine

## 2019-07-24 NOTE — Telephone Encounter (Signed)
Called pt to inform him that pathology report showed his growth was a common wart.  No need for further biopsy.  If it starts to become bothersome again we can attempt cryotherapy.    Pt reported he is also eating better and drinking more water.

## 2019-09-24 ENCOUNTER — Encounter (HOSPITAL_COMMUNITY): Payer: Self-pay | Admitting: Emergency Medicine

## 2019-09-24 ENCOUNTER — Other Ambulatory Visit: Payer: Self-pay

## 2019-09-24 ENCOUNTER — Emergency Department (HOSPITAL_COMMUNITY): Payer: Self-pay

## 2019-09-24 ENCOUNTER — Ambulatory Visit (HOSPITAL_COMMUNITY)
Admission: EM | Admit: 2019-09-24 | Discharge: 2019-09-24 | Payer: Self-pay | Attending: Family Medicine | Admitting: Family Medicine

## 2019-09-24 ENCOUNTER — Emergency Department (HOSPITAL_COMMUNITY)
Admission: EM | Admit: 2019-09-24 | Discharge: 2019-09-24 | Disposition: A | Discharge status: Home / Self Care | Payer: Self-pay | Attending: Emergency Medicine | Admitting: Emergency Medicine

## 2019-09-24 ENCOUNTER — Encounter (HOSPITAL_COMMUNITY): Payer: Self-pay

## 2019-09-24 DIAGNOSIS — I10 Essential (primary) hypertension: Secondary | ICD-10-CM | POA: Insufficient documentation

## 2019-09-24 DIAGNOSIS — R9431 Abnormal electrocardiogram [ECG] [EKG]: Secondary | ICD-10-CM

## 2019-09-24 DIAGNOSIS — R079 Chest pain, unspecified: Secondary | ICD-10-CM

## 2019-09-24 DIAGNOSIS — Z87891 Personal history of nicotine dependence: Secondary | ICD-10-CM | POA: Insufficient documentation

## 2019-09-24 DIAGNOSIS — K219 Gastro-esophageal reflux disease without esophagitis: Secondary | ICD-10-CM | POA: Insufficient documentation

## 2019-09-24 DIAGNOSIS — R0789 Other chest pain: Secondary | ICD-10-CM | POA: Insufficient documentation

## 2019-09-24 DIAGNOSIS — R066 Hiccough: Secondary | ICD-10-CM | POA: Insufficient documentation

## 2019-09-24 LAB — CBC
HCT: 45.6 % (ref 39.0–52.0)
Hemoglobin: 15.3 g/dL (ref 13.0–17.0)
MCH: 31.7 pg (ref 26.0–34.0)
MCHC: 33.6 g/dL (ref 30.0–36.0)
MCV: 94.6 fL (ref 80.0–100.0)
Platelets: 256 10*3/uL (ref 150–400)
RBC: 4.82 MIL/uL (ref 4.22–5.81)
RDW: 13.2 % (ref 11.5–15.5)
WBC: 6.8 10*3/uL (ref 4.0–10.5)
nRBC: 0 % (ref 0.0–0.2)

## 2019-09-24 LAB — HEPATIC FUNCTION PANEL
ALT: 26 U/L (ref 0–44)
AST: 25 U/L (ref 15–41)
Albumin: 3.9 g/dL (ref 3.5–5.0)
Alkaline Phosphatase: 65 U/L (ref 38–126)
Bilirubin, Direct: 0.3 mg/dL — ABNORMAL HIGH (ref 0.0–0.2)
Indirect Bilirubin: 1.2 mg/dL — ABNORMAL HIGH (ref 0.3–0.9)
Total Bilirubin: 1.5 mg/dL — ABNORMAL HIGH (ref 0.3–1.2)
Total Protein: 6.6 g/dL (ref 6.5–8.1)

## 2019-09-24 LAB — LIPASE, BLOOD: Lipase: 25 U/L (ref 11–51)

## 2019-09-24 LAB — BASIC METABOLIC PANEL
Anion gap: 10 (ref 5–15)
BUN: 9 mg/dL (ref 6–20)
CO2: 26 mmol/L (ref 22–32)
Calcium: 9.8 mg/dL (ref 8.9–10.3)
Chloride: 103 mmol/L (ref 98–111)
Creatinine, Ser: 1.16 mg/dL (ref 0.61–1.24)
GFR calc Af Amer: >60 mL/min (ref >60)
GFR calc non Af Amer: >60 mL/min (ref >60)
Glucose, Bld: 94 mg/dL (ref 70–99)
Potassium: 3.8 mmol/L (ref 3.5–5.1)
Sodium: 139 mmol/L (ref 135–145)

## 2019-09-24 LAB — TROPONIN I (HIGH SENSITIVITY)
Troponin I (High Sensitivity): 4 ng/L (ref <18)
Troponin I (High Sensitivity): 4 ng/L (ref <18)

## 2019-09-24 MED ORDER — ALUM & MAG HYDROXIDE-SIMETH 200-200-20 MG/5ML PO SUSP
30.0000 mL | Freq: Once | ORAL | Status: AC
  Administered 2019-09-24: 30 mL via ORAL
  Filled 2019-09-24: qty 30

## 2019-09-24 MED ORDER — FAMOTIDINE 20 MG PO TABS: 20.0000 mg | ORAL_TABLET | Freq: Two times a day (BID) | ORAL | 0 refills | Status: DC

## 2019-09-24 MED ORDER — LIDOCAINE VISCOUS HCL 2 % MT SOLN
15.0000 mL | Freq: Once | OROMUCOSAL | Status: AC
  Administered 2019-09-24: 15 mL via ORAL
  Filled 2019-09-24: qty 15

## 2019-09-24 MED ORDER — SODIUM CHLORIDE 0.9% FLUSH
3.0000 mL | Freq: Once | INTRAVENOUS | Status: AC
  Administered 2019-09-24: 3 mL via INTRAVENOUS

## 2019-09-24 MED ORDER — FAMOTIDINE IN NACL 20-0.9 MG/50ML-% IV SOLN
20.0000 mg | Freq: Once | INTRAVENOUS | Status: AC
Start: 2019-09-24 — End: 2019-09-24
  Administered 2019-09-24: 20 mg via INTRAVENOUS
  Filled 2019-09-24: qty 50

## 2019-09-24 NOTE — Discharge Instructions (Addendum)
Abnormal EKG with chest pain.  Advised to go to the ER for further evaluation.

## 2019-09-24 NOTE — ED Provider Notes (Signed)
[facility]    CSN: [id number] Arrival date & time: 09/24/19  1347      History   Chief Complaint Chief Complaint  Patient presents with  . Chest Pain    HPI Larry Phillips is a 44 y.o. male.   Patient reports chest pain for the last 3 days.  Has become more intense since day 1.  Reports the pain is more of a tightness and a pressure sensation.  Reports that it has increased in intensity today.  Denies cardiac history.  Denies hypertension.  Reports feeling a fluttering sensation in his chest 2 nights ago.  Reports that there is nothing that he can do to make the pain better or worse in his chest.  Also reports having hiccups for the last 3 days.  Denies shortness of breath, headache, nausea, vomiting, diarrhea, rash, other symptoms.  ROS per HPI  The history is provided by the patient.  Chest Pain   History reviewed. No pertinent past medical history.  Patient Active Problem List   Diagnosis Date Noted  . Atypical mole 07/14/2019  . Healthcare maintenance 07/14/2019    History reviewed. No pertinent surgical history.     Home Medications    Prior to Admission medications   Medication Sig Start Date End Date Taking? Authorizing Provider  cephALEXin (KEFLEX) 500 MG capsule Take 1 capsule (500 mg total) by mouth 4 (four) times daily. 03/08/19   Joy, Shawn C, PA-C  diclofenac (CATAFLAM) 50 MG tablet Take 1 tablet (50 mg total) by mouth 3 (three) times daily. One tablet TID with food prn pain. 06/17/15   Janne Napoleon, NP  traMADol (ULTRAM) 50 MG tablet Take 1 tablet (50 mg total) by mouth every 6 (six) hours as needed for moderate pain. 06/17/15   Janne Napoleon, NP    Family History Family History  Problem Relation Age of Onset  . COPD Mother   . Healthy Father     Social History Social History   Tobacco Use  . Smoking status: Former Research scientist (life sciences)  . Smokeless tobacco: Never Used  Substance Use Topics  . Alcohol use: Yes  . Drug use: Yes    Types:  Marijuana     Allergies   Patient has no known allergies.   Review of Systems Review of Systems  Cardiovascular: Positive for chest pain.     Physical Exam Triage Vital Signs ED Triage Vitals  Enc Vitals Group     BP 09/24/19 1407 (!) 175/100     Pulse Rate 09/24/19 1407 74     Resp 09/24/19 1407 20     Temp 09/24/19 1407 99.1 F (37.3 C)     Temp Source 09/24/19 1407 Oral     SpO2 09/24/19 1407 97 %     Weight --      Height --      Head Circumference --      Peak Flow --      Pain Score 09/24/19 1406 6     Pain Loc --      Pain Edu? --      Excl. in [place]? --    No data found.  Updated Vital Signs BP (!) 175/100 (BP Location: Right Arm)   Pulse 74   Temp 99.1 F (37.3 C) (Oral)   Resp 20   SpO2 97%   Visual Acuity Right Eye Distance:   Left Eye Distance:   Bilateral Distance:    Right Eye Near:   Left Eye  Near:    Bilateral Near:     Physical Exam Vitals and nursing note reviewed.  Constitutional:      General: He is not in acute distress.    Appearance: He is well-developed.  HENT:     Head: Normocephalic and atraumatic.  Eyes:     Conjunctiva/sclera: Conjunctivae normal.  Cardiovascular:     Rate and Rhythm: Normal rate and regular rhythm.     Heart sounds: Normal heart sounds. No murmur.  Pulmonary:     Effort: Pulmonary effort is normal. No tachypnea or respiratory distress.     Breath sounds: Normal breath sounds. No decreased breath sounds, wheezing, rhonchi or rales.  Abdominal:     General: Bowel sounds are normal.     Palpations: Abdomen is soft.     Tenderness: There is no abdominal tenderness.  Musculoskeletal:     Cervical back: Neck supple.  Skin:    General: Skin is warm and dry.     Capillary Refill: Capillary refill takes less than 2 seconds.  Neurological:     General: No focal deficit present.     Mental Status: He is alert.  Psychiatric:        Mood and Affect: Mood normal.        Behavior: Behavior normal.       UC Treatments / Results  Labs (all labs ordered are listed, but only abnormal results are displayed) Labs Reviewed - No data to display  EKG   Radiology No results found.  Procedures Procedures (including critical care time)  Medications Ordered in UC Medications - No data to display  Initial Impression / Assessment and Plan / UC Course  I have reviewed the triage vital signs and the nursing notes.  Pertinent labs & imaging results that were available during my care of the patient were reviewed by me and considered in my medical decision making (see chart for details).     ED ECG REPORT   Date: 09/24/2019  Rate: 70  Rhythm: sinus rhythm with ST elevation in leads V1, V2, V3, V4  QRS Axis: indeterminate  Intervals: normal  ST/T Wave abnormalities: ST elevations anteriorly, ST elevation in septal leads as well  Conduction Disutrbances:possible STEMI  Narrative Interpretation:   Old EKG Reviewed: none available  I have personally reviewed the EKG tracing and agree with the computerized printout as noted.  ST elevation noted on EKG.  Sent to ER for further evaluation and work-up.  Patient declined EMS transport, stated that his wife was in the parking lot and they would drive over to the ER.  Final Clinical Impressions(s) / UC Diagnoses   Final diagnoses:  Chest pain, unspecified type  Nonspecific abnormal electrocardiogram (ECG) (EKG)     Discharge Instructions     Abnormal EKG with chest pain.  Advised to go to the ER for further evaluation.    ED Prescriptions    None     I have reviewed the PDMP during this encounter.   Faustino Congress, NP 09/24/19 1446

## 2019-09-24 NOTE — ED Notes (Signed)
Patient is being discharged from the [facility] and sent to the Emergency Department via personal vehicle by self. Per provider Georgina Peer, patient is stable but in need of higher level of care due to abnormal EKG & Chest Pain. Patient is aware and verbalizes understanding of plan of care.    Vitals:   09/24/19 1407  BP: (!) 175/100  Pulse: 74  Resp: 20  Temp: 99.1 F (37.3 C)  SpO2: 97%

## 2019-09-24 NOTE — ED Notes (Signed)
Pt sleeps unless disturbed

## 2019-09-24 NOTE — ED Provider Notes (Signed)
[facility] EMERGENCY DEPARTMENT Provider Note   CSN: [id number] Arrival date & time: 09/24/19  1439     History No chief complaint on file.   Larry Phillips is a 44 y.o. male.  Pt presents to the ED today with hiccups and cp.  Pt said hiccups started 3 days ago.  He woke up with cp 2 nights ago.  The pt said the hiccups have continued and the cp continues.  The pt said he is now sore in his chest from all the hiccups.  He felt like there was something grabbing his heart last night.  This is what prompted him to come in.  He initially went to UC.  They sent him here due to an abnormal EKG.  Pt has been told his bp is elevated, but is not on any meds for it.  Pt's BP was 140 at his FP resident visit in December.  Pt has not taken any otc meds for his sx.        History reviewed. No pertinent past medical history.  Patient Active Problem List   Diagnosis Date Noted  . Atypical mole 07/14/2019  . Healthcare maintenance 07/14/2019    History reviewed. No pertinent surgical history.     Family History  Problem Relation Age of Onset  . COPD Mother   . Healthy Father     Social History   Tobacco Use  . Smoking status: Former Research scientist (life sciences)  . Smokeless tobacco: Never Used  Substance Use Topics  . Alcohol use: Yes  . Drug use: Yes    Types: Marijuana    Home Medications Prior to Admission medications   Medication Sig Start Date End Date Taking? Authorizing Provider  diclofenac (CATAFLAM) 50 MG tablet Take 1 tablet (50 mg total) by mouth 3 (three) times daily. One tablet TID with food prn pain. Patient not taking: Reported on 09/24/2019 06/17/15   Janne Napoleon, NP  famotidine (PEPCID) 20 MG tablet Take 1 tablet (20 mg total) by mouth 2 (two) times daily. 09/24/19   Isla Pence, MD  traMADol (ULTRAM) 50 MG tablet Take 1 tablet (50 mg total) by mouth every 6 (six) hours as needed for moderate pain. Patient not taking: Reported on 09/24/2019 06/17/15   Janne Napoleon, NP    Allergies    Patient has no known allergies.  Review of Systems   Review of Systems  Cardiovascular: Positive for chest pain.  Gastrointestinal:       Hiccups  All other systems reviewed and are negative.   Physical Exam Updated Vital Signs BP (!) 147/86   Pulse 70   Temp 98.2 F (36.8 C) (Oral)   Resp 13   Ht 6\' 2"  (1.88 m)   Wt 112.3 kg   SpO2 100%   BMI 31.79 kg/m   Physical Exam Vitals and nursing note reviewed.  Constitutional:      Appearance: Normal appearance. He is obese.  HENT:     Head: Normocephalic and atraumatic.     Right Ear: External ear normal.     Left Ear: External ear normal.     Nose: Nose normal.     Mouth/Throat:     Mouth: Mucous membranes are moist.     Pharynx: Oropharynx is clear.  Eyes:     Extraocular Movements: Extraocular movements intact.     Conjunctiva/sclera: Conjunctivae normal.     Pupils: Pupils are equal, round, and reactive to light.  Cardiovascular:  Rate and Rhythm: Normal rate and regular rhythm.     Pulses: Normal pulses.     Heart sounds: Normal heart sounds.  Pulmonary:     Effort: Pulmonary effort is normal.     Breath sounds: Normal breath sounds.  Abdominal:     General: Abdomen is flat. Bowel sounds are normal.     Palpations: Abdomen is soft.  Musculoskeletal:        General: Normal range of motion.     Cervical back: Normal range of motion and neck supple.  Skin:    General: Skin is warm.     Capillary Refill: Capillary refill takes less than 2 seconds.  Neurological:     General: No focal deficit present.     Mental Status: He is alert and oriented to person, place, and time.  Psychiatric:        Mood and Affect: Mood normal.        Behavior: Behavior normal.        Thought Content: Thought content normal.        Judgment: Judgment normal.     ED Results / Procedures / Treatments   Labs (all labs ordered are listed, but only abnormal results are displayed) Labs Reviewed   HEPATIC FUNCTION PANEL - Abnormal; Notable for the following components:      Result Value   Total Bilirubin 1.5 (*)    Bilirubin, Direct 0.3 (*)    Indirect Bilirubin 1.2 (*)    All other components within normal limits  BASIC METABOLIC PANEL  CBC  LIPASE, BLOOD  TROPONIN I (HIGH SENSITIVITY)  TROPONIN I (HIGH SENSITIVITY)    EKG None  Radiology DG Chest 2 View  Result Date: 09/24/2019 CLINICAL DATA:  Chest pain today. EXAM: CHEST - 2 VIEW COMPARISON:  PA and lateral chest 03/15/2011. FINDINGS: Lungs clear. Heart size normal. No pneumothorax or pleural fluid. No acute or focal bony abnormality. IMPRESSION: Negative chest. Electronically Signed   By: Inge Rise M.D.   On: 09/24/2019 15:35    Procedures Procedures (including critical care time)  Medications Ordered in ED Medications  sodium chloride flush (NS) 0.9 % injection 3 mL (3 mLs Intravenous Given 09/24/19 1625)  alum & mag hydroxide-simeth (MAALOX/MYLANTA) 200-200-20 MG/5ML suspension 30 mL (30 mLs Oral Given 09/24/19 1624)    And  lidocaine (XYLOCAINE) 2 % viscous mouth solution 15 mL (15 mLs Oral Given 09/24/19 1624)  famotidine (PEPCID) IVPB 20 mg premix (0 mg Intravenous Stopped 09/24/19 1701)    ED Course  I have reviewed the triage vital signs and the nursing notes.  Pertinent labs & imaging results that were available during my care of the patient were reviewed by me and considered in my medical decision making (see chart for details).    MDM Rules/Calculators/A&P                      Pt's cp is likely from gerd which is also likely causing hiccups.  The pt's bp has come down since he's been here.  Down to 147/86.  Pt is encouraged to exercise and to lose weight.  He is encouraged to eat a low salt diet.  Pt encouraged to avoid carbonated beverages and to not eat within 2 hours of going to bed.  Pt knows to return if worse.    Final Clinical Impression(s) / ED Diagnoses Final diagnoses:  Atypical  chest pain  Hiccups  Gastroesophageal reflux disease, unspecified whether esophagitis present  Essential hypertension    Rx / DC Orders ED Discharge Orders         Ordered    famotidine (PEPCID) 20 MG tablet  2 times daily     09/24/19 1814           Isla Pence, MD 09/24/19 1816

## 2019-09-24 NOTE — ED Triage Notes (Signed)
Pt from [facility].  Reports intermittent hiccups x 3 days.  Onset of chest pain 2 nights ago.  States last night it was fluttering pain and now has soreness to chest.  Denies SOB, nausea, and vomiting.

## 2019-09-24 NOTE — ED Triage Notes (Signed)
Pt states he had long-occurring recurring hiccups for past three days. Mid sternal chest discomfort onset two nights ago that quickly resolved.  Describes "fluttering" feeling two nights ago.  Denies SOB, dizziness, n/v.  Pt states [facility] blood work/physical approx two months ago. Pt  states opthalmology informed him evidence of potential HTN on left eye exam

## 2020-03-06 ENCOUNTER — Encounter (HOSPITAL_COMMUNITY): Payer: Self-pay | Admitting: Emergency Medicine

## 2020-03-06 ENCOUNTER — Emergency Department (HOSPITAL_COMMUNITY)
Admission: EM | Admit: 2020-03-06 | Discharge: 2020-03-06 | Disposition: A | Discharge status: Home / Self Care | Payer: Self-pay | Attending: Emergency Medicine | Admitting: Emergency Medicine

## 2020-03-06 DIAGNOSIS — Z5321 Procedure and treatment not carried out due to patient leaving prior to being seen by health care provider: Secondary | ICD-10-CM | POA: Insufficient documentation

## 2020-03-06 DIAGNOSIS — R1110 Vomiting, unspecified: Secondary | ICD-10-CM | POA: Insufficient documentation

## 2020-03-06 NOTE — ED Notes (Signed)
Patient also called for registration with no answer, unable to locate patient in ED.

## 2020-03-06 NOTE — ED Triage Notes (Signed)
Pt reports he did not feel well last night, his wife checked his bp, (unsure what it was but states it got up into the 200s) this morning states he vomited when he tried to eat a banana. Denies hx of hypertension, denies headaches, a/ox4, resp e/u, nad.

## 2020-03-06 NOTE — ED Notes (Signed)
Patient called x3 for vitals.

## 2020-03-07 ENCOUNTER — Ambulatory Visit (HOSPITAL_COMMUNITY)
Admission: EM | Admit: 2020-03-07 | Discharge: 2020-03-07 | Disposition: A | Discharge status: Home / Self Care | Payer: Self-pay | Attending: Physician Assistant | Admitting: Physician Assistant

## 2020-03-07 ENCOUNTER — Other Ambulatory Visit: Payer: Self-pay

## 2020-03-07 ENCOUNTER — Encounter (HOSPITAL_COMMUNITY): Payer: Self-pay | Admitting: Emergency Medicine

## 2020-03-07 DIAGNOSIS — I10 Essential (primary) hypertension: Secondary | ICD-10-CM | POA: Insufficient documentation

## 2020-03-07 LAB — BASIC METABOLIC PANEL
Anion gap: 11 (ref 5–15)
BUN: 11 mg/dL (ref 6–20)
CO2: 23 mmol/L (ref 22–32)
Calcium: 9.5 mg/dL (ref 8.9–10.3)
Chloride: 105 mmol/L (ref 98–111)
Creatinine, Ser: 1.1 mg/dL (ref 0.61–1.24)
GFR calc Af Amer: >60 mL/min (ref >60)
GFR calc non Af Amer: >60 mL/min (ref >60)
Glucose, Bld: 92 mg/dL (ref 70–99)
Potassium: 3.3 mmol/L — ABNORMAL LOW (ref 3.5–5.1)
Sodium: 139 mmol/L (ref 135–145)

## 2020-03-07 MED ORDER — AMLODIPINE BESYLATE 5 MG PO TABS: 5.0000 mg | ORAL_TABLET | Freq: Every day | ORAL | 1 refills | Status: DC

## 2020-03-07 NOTE — Discharge Instructions (Signed)
Start the medication, 1 tablet daily  If you have severe dizziness, severe headache, chest pain or shortness of breath go to the Emergency Department  I am checking basic labs, I will call if we need to discuss, otherwise they will be in your mychart  Follow up with your Primary care in 1-2 weeks, if unable to have follow up in that time, return to this clinic in 2 weeks for blood pressure check.  Monitor BP at home

## 2020-03-07 NOTE — ED Provider Notes (Signed)
[facility]    CSN: [id number] Arrival date & time: 03/07/20  1755      History   Chief Complaint Chief Complaint  Patient presents with  . Hypertension    HPI Larry Phillips is a 44 y.o. male.   Patient presents for evaluation of high blood pressure.  He reports a few days ago he noticed his blood pressure was greater than 200 at home.  He noticed a dizziness and feeling unwell, this prompted him to go to the emergency department.  He vomited while at the emergency department.  However he did not stay to be seen due to wait times.  Since then he has had improvement in his blood pressure and is felt better.  He reports today he does not have any dizziness, headache, shortness of breath, chest pain.  He has never been on blood pressure medicine.  He was previously followed by primary care provider until 2021, he is currently in the process of switching and has an appointment September 2021.  Notes previous blood pressures were fairly well controlled.  He has been trying to eat less salt and watch his diet.     History reviewed. No pertinent past medical history.  Patient Active Problem List   Diagnosis Date Noted  . Atypical mole 07/14/2019  . Healthcare maintenance 07/14/2019    History reviewed. No pertinent surgical history.     Home Medications    Prior to Admission medications   Medication Sig Start Date End Date Taking? Authorizing Provider  amLODipine (NORVASC) 5 MG tablet Take 1 tablet (5 mg total) by mouth daily. 03/07/20   Ryleah Miramontes, Marguerita Beards, PA-C  diclofenac (CATAFLAM) 50 MG tablet Take 1 tablet (50 mg total) by mouth 3 (three) times daily. One tablet TID with food prn pain. Patient not taking: Reported on 09/24/2019 06/17/15   Janne Napoleon, NP  famotidine (PEPCID) 20 MG tablet Take 1 tablet (20 mg total) by mouth 2 (two) times daily. 09/24/19   Isla Pence, MD  traMADol (ULTRAM) 50 MG tablet Take 1 tablet (50 mg total) by mouth every 6 (six) hours as  needed for moderate pain. Patient not taking: Reported on 09/24/2019 06/17/15   Janne Napoleon, NP    Family History Family History  Problem Relation Age of Onset  . COPD Mother   . Healthy Father     Social History Social History   Tobacco Use  . Smoking status: Former Research scientist (life sciences)  . Smokeless tobacco: Never Used  Vaping Use  . Vaping Use: Never used  Substance Use Topics  . Alcohol use: Yes  . Drug use: Yes    Types: Marijuana     Allergies   Patient has no known allergies.   Review of Systems Review of Systems   Physical Exam Triage Vital Signs ED Triage Vitals  Enc Vitals Group     BP 03/07/20 1809 (!) 151/108     Pulse Rate 03/07/20 1809 82     Resp 03/07/20 1809 20     Temp 03/07/20 1809 98.6 F (37 C)     Temp Source 03/07/20 1809 Oral     SpO2 03/07/20 1809 99 %     Weight --      Height --      Head Circumference --      Peak Flow --      Pain Score 03/07/20 1808 0     Pain Loc --      Pain Edu? --  Excl. in [place]? --    No data found.  Updated Vital Signs BP (!) 151/108 (BP Location: Left Arm)   Pulse 82   Temp 98.6 F (37 C) (Oral)   Resp 20   SpO2 99%   Visual Acuity Right Eye Distance:   Left Eye Distance:   Bilateral Distance:    Right Eye Near:   Left Eye Near:    Bilateral Near:     Physical Exam Vitals and nursing note reviewed.  Constitutional:      General: He is not in acute distress.    Appearance: He is well-developed. He is not ill-appearing.  HENT:     Head: Normocephalic and atraumatic.  Eyes:     Conjunctiva/sclera: Conjunctivae normal.  Cardiovascular:     Rate and Rhythm: Normal rate and regular rhythm.     Heart sounds: No murmur heard.   Pulmonary:     Effort: Pulmonary effort is normal. No respiratory distress.     Breath sounds: Normal breath sounds.  Abdominal:     Palpations: Abdomen is soft.     Tenderness: There is no abdominal tenderness.  Musculoskeletal:     Cervical back: Neck supple.   Skin:    General: Skin is warm and dry.  Neurological:     General: No focal deficit present.     Mental Status: He is alert and oriented to person, place, and time.     Cranial Nerves: No cranial nerve deficit.     Sensory: No sensory deficit.     Motor: No weakness.     Coordination: Coordination normal.     Gait: Gait normal.     Deep Tendon Reflexes: Reflexes normal.      UC Treatments / Results  Labs (all labs ordered are listed, but only abnormal results are displayed) Labs Reviewed  BASIC METABOLIC PANEL - Abnormal; Notable for the following components:      Result Value   Potassium 3.3 (*)    All other components within normal limits    EKG   Radiology No results found.  Procedures Procedures (including critical care time)  Medications Ordered in UC Medications - No data to display  Initial Impression / Assessment and Plan / UC Course  I have reviewed the triage vital signs and the nursing notes.  Pertinent labs & imaging results that were available during my care of the patient were reviewed by me and considered in my medical decision making (see chart for details).     #Hypertension Patient is a 44 year old presenting with hypertension.  Multiple recorded blood pressures above 130/80.  Will initiate on 5 mg amlodipine.  BMP largely unremarkable.  Encourage patient to have sooner follow-up with primary care.  Instructed to get on a follow-up in 2 weeks return to clinic for blood pressure recheck.  Strict emergency department precautions discussed.  Patient verbalized understanding. Final Clinical Impressions(s) / UC Diagnoses   Final diagnoses:  Essential hypertension     Discharge Instructions     Start the medication, 1 tablet daily  If you have severe dizziness, severe headache, chest pain or shortness of breath go to the Emergency Department  I am checking basic labs, I will call if we need to discuss, otherwise they will be in your  mychart  Follow up with your Primary care in 1-2 weeks, if unable to have follow up in that time, return to this clinic in 2 weeks for blood pressure check.  Monitor BP at home  ED Prescriptions    Medication Sig Dispense Auth. Provider   amLODipine (NORVASC) 5 MG tablet Take 1 tablet (5 mg total) by mouth daily. 30 tablet Aceyn Kathol, Marguerita Beards, PA-C     PDMP not reviewed this encounter.   Purnell Shoemaker, PA-C 03/07/20 2033

## 2020-03-07 NOTE — ED Triage Notes (Signed)
Pt c/o feeling dizzy the night before last and his blood pressure was reading in the 200s/100s. He states he felt dizzy and vomited and he had a headache. Pt states he went to the ER but the wait was too long. He states the last reading he took today was 166/123 at 12:10 pm.

## 2020-03-09 ENCOUNTER — Ambulatory Visit (HOSPITAL_COMMUNITY)
Admission: EM | Admit: 2020-03-09 | Discharge: 2020-03-09 | Disposition: A | Discharge status: Home / Self Care | Payer: Self-pay | Attending: Family Medicine | Admitting: Family Medicine

## 2020-03-09 ENCOUNTER — Other Ambulatory Visit: Payer: Self-pay

## 2020-03-09 ENCOUNTER — Encounter (HOSPITAL_COMMUNITY): Payer: Self-pay | Admitting: Emergency Medicine

## 2020-03-09 DIAGNOSIS — I10 Essential (primary) hypertension: Secondary | ICD-10-CM

## 2020-03-09 HISTORY — DX: Essential (primary) hypertension: I10

## 2020-03-09 MED ORDER — LOSARTAN POTASSIUM 25 MG PO TABS: 25.0000 mg | ORAL_TABLET | Freq: Every day | ORAL | 0 refills | Status: DC

## 2020-03-09 NOTE — ED Provider Notes (Signed)
[facility]    CSN: [id number] Arrival date & time: 03/09/20  1631      History   Chief Complaint Chief Complaint  Patient presents with  . Hypertension  . Dizziness    HPI Larry Phillips is a 44 y.o. male.   His blood pressure continues to be elevated.  He has been started recently on amlodipine.  HPI  Past Medical History:  Diagnosis Date  . Hypertension     Patient Active Problem List   Diagnosis Date Noted  . Atypical mole 07/14/2019  . Healthcare maintenance 07/14/2019    No past surgical history on file.     Home Medications    Prior to Admission medications   Medication Sig Start Date End Date Taking? Authorizing Provider  amLODipine (NORVASC) 5 MG tablet Take 1 tablet (5 mg total) by mouth daily. 03/07/20  Yes Darr, Marguerita Beards, PA-C  famotidine (PEPCID) 20 MG tablet Take 1 tablet (20 mg total) by mouth 2 (two) times daily. 09/24/19   Isla Pence, MD  losartan (COZAAR) 25 MG tablet Take 1 tablet (25 mg total) by mouth daily. 03/09/20   Rosemarie Ax, MD    Family History Family History  Problem Relation Age of Onset  . COPD Mother   . Healthy Father     Social History Social History   Tobacco Use  . Smoking status: Former Research scientist (life sciences)  . Smokeless tobacco: Never Used  Vaping Use  . Vaping Use: Never used  Substance Use Topics  . Alcohol use: Yes  . Drug use: Yes    Types: Marijuana     Allergies   Patient has no known allergies.   Review of Systems Review of Systems  See HPI  Physical Exam Triage Vital Signs ED Triage Vitals  Enc Vitals Group     BP 03/09/20 1736 (!) 163/101     Pulse Rate 03/09/20 1736 86     Resp 03/09/20 1736 18     Temp 03/09/20 1736 98.9 F (37.2 C)     Temp Source 03/09/20 1736 Oral     SpO2 03/09/20 1736 98 %     Weight --      Height --      Head Circumference --      Peak Flow --      Pain Score 03/09/20 1734 3     Pain Loc --      Pain Edu? --      Excl. in [place]? --    No data  found.  Updated Vital Signs BP (!) 163/101 (BP Location: Right Arm) Comment (BP Location): large cuff  Pulse 86   Temp 98.9 F (37.2 C) (Oral)   Resp 18   SpO2 98%   Visual Acuity Right Eye Distance:   Left Eye Distance:   Bilateral Distance:    Right Eye Near:   Left Eye Near:    Bilateral Near:     Physical Exam Gen: NAD, alert, cooperative with exam, well-appearing ENT: normal lips, normal nasal mucosa,  Eye: normal EOM, normal conjunctiva and lids CV: Regular rhythm Resp: no accessory muscle use, non-labored,  Skin: no rashes, no areas of induration  Neuro: normal tone, normal sensation to touch Psych:  normal insight, alert and oriented    UC Treatments / Results  Labs (all labs ordered are listed, but only abnormal results are displayed) Labs Reviewed - No data to display  EKG   Radiology No results found.  Procedures  Procedures (including critical care time)  Medications Ordered in UC Medications - No data to display  Initial Impression / Assessment and Plan / UC Course  I have reviewed the triage vital signs and the nursing notes.  Pertinent labs & imaging results that were available during my care of the patient were reviewed by me and considered in my medical decision making (see chart for details).     Larry Phillips is a 44 year old male that is presenting with ongoing uncontrolled hypertension.  Recently started amlodipine.  Counseled on taking 10 mg of amlodipine and start losartan.  Counseled on lightheadedness that may be associated with any antihypertensives.  Has follow-up scheduled with primary care sometime next week.  Given indications to follow-up.  Final Clinical Impressions(s) / UC Diagnoses   Final diagnoses:  Essential hypertension     Discharge Instructions     Please continue the exercises and diet  Keep up the good work  Please continue taking your blood pressure  Please take 2 pills of the amlodipine  Please follow up if  your symptoms fail to improve.     ED Prescriptions    Medication Sig Dispense Auth. Provider   losartan (COZAAR) 25 MG tablet Take 1 tablet (25 mg total) by mouth daily. 30 tablet Rosemarie Ax, MD     PDMP not reviewed this encounter.   Rosemarie Ax, MD 03/09/20 [phone]

## 2020-03-09 NOTE — ED Triage Notes (Signed)
Patient was seen 03/07/2020.  Patient continues to have headache, dizziness and continued high blood pressure readings.

## 2020-03-09 NOTE — Discharge Instructions (Signed)
Please continue the exercises and diet  Keep up the good work  Please continue taking your blood pressure  Please take 2 pills of the amlodipine  Please follow up if your symptoms fail to improve.

## 2020-03-09 NOTE — ED Notes (Signed)
Compared patient's personal blood pressure equipment to reading obtained in in take room.  150/105, 88.

## 2020-03-11 ENCOUNTER — Ambulatory Visit (INDEPENDENT_AMBULATORY_CARE_PROVIDER_SITE_OTHER): Payer: Self-pay | Admitting: Family Medicine

## 2020-03-11 ENCOUNTER — Other Ambulatory Visit: Payer: Self-pay

## 2020-03-11 VITALS — BP 122/64 | HR 73 | Ht 74.0 in | Wt 255.0 lb

## 2020-03-11 DIAGNOSIS — G4733 Obstructive sleep apnea (adult) (pediatric): Secondary | ICD-10-CM

## 2020-03-11 DIAGNOSIS — I10 Essential (primary) hypertension: Secondary | ICD-10-CM

## 2020-03-11 NOTE — Patient Instructions (Signed)
Blood pressure: I am glad that we are finally treating you for this elevated blood pressure.  Thank the medications that you are on right now are appropriate.  Yes, you can take them both at once.  I recommend that you take them either at dinner or a little bit later in the evening.  Lets have you follow-up for a nurse visit and blood work in 1 week.  We will plan to do the tetanus vaccine at that visit.  I have put in a referral for you to have a sleep study done to test for obstructive sleep apnea.  You should get a call in the next 2 weeks to help set that up.

## 2020-03-11 NOTE — Assessment & Plan Note (Addendum)
His history was initially suspicious for elevated blood pressure in the setting of acute infection or altered medical state.  On further review of his chart, he has blood pressures consistently elevated with systolic pressures routinely between 140-160.  It appears that he has had poorly controlled blood pressures for some years now.  An A1c performed about 6 months ago shows an average blood glucose in the normal range.  His stop bang questionnaire is suspicious for obstructive sleep apnea which may be contributing to elevated blood pressure. -Continue amlodipine 10 mg daily -Continue losartan 25 mg daily -Nurse visit in 1 week for blood work (to check kidney electrolyte levels) and tetanus shot -Ambulatory referral to sleep medicine placed

## 2020-03-11 NOTE — Progress Notes (Signed)
    SUBJECTIVE:   CHIEF COMPLAINT / HPI:   Hypertension Larry Phillips reports that he began to experience dizziness and headaches last week and was feeling generally unwell.  At that time, his wife took his blood pressure and it was elevated with a systolic pressure of over 200.  He was seen later that day in the emergency room at which time labs were drawn.  He was ultimately diagnosed with hypertension and advised to start amlodipine 5 mg.  He continued to have discomforting symptoms and was seen at urgent care 2 days later at which time his blood pressure remained elevated.  At the urgent care appointment, he was encouraged to max out his amlodipine at 10 mg and start taking losartan 25 mg daily he was also told to follow-up closely with a primary care provider.  Today, he notes total resolution of his after mentioned dizziness and headache.  He no longer feels unwell at all.  He has been taking his medication as prescribed but wants to know if it is dangerous for him to take so much medication at once.  He does not want to drop his blood pressure too much too quickly.  Concern for OSA He is not aware that he snores but he has previously woken up at night gasping for air.  He is suspicious that he does stop breathing at night.  Additionally, is currently being treated for blood pressure and he is male.   PERTINENT  PMH / PSH: BMI of 32  OBJECTIVE:   BP 122/64   Pulse 73   Ht 6\' 2"  (1.88 m)   Wt 255 lb (115.7 kg)   SpO2 99%   BMI 32.74 kg/m    General: Alert and cooperative and appears to be in no acute distress Cardio: Normal S1 and S2, no S3 or S4. Rhythm is regular. No murmurs or rubs.   Pulm: Clear to auscultation bilaterally, no crackles, wheezing, or diminished breath sounds. Normal respiratory effort Abdomen: Bowel sounds normal. Abdomen soft and non-tender.  Extremities: No peripheral edema. Warm/ well perfused.  Strong radial pulse. Neuro: Cranial nerves grossly  intact   ASSESSMENT/PLAN:   Hypertension His history was initially suspicious for elevated blood pressure in the setting of acute infection or altered medical state.  On further review of his chart, he has blood pressures consistently elevated with systolic pressures routinely between 140-160.  It appears that he has had poorly controlled blood pressures for some years now.  An A1c performed about 6 months ago shows an average blood glucose in the normal range.  His stop bang questionnaire is suspicious for obstructive sleep apnea which may be contributing to elevated blood pressure. -Continue amlodipine 10 mg daily -Continue losartan 25 mg daily -Nurse visit in 1 week for blood work (to check kidney electrolyte levels) and tetanus shot -Ambulatory referral to sleep medicine placed     Matilde Haymaker, MD [place]

## 2020-03-22 ENCOUNTER — Other Ambulatory Visit: Payer: Self-pay

## 2020-03-22 DIAGNOSIS — I10 Essential (primary) hypertension: Secondary | ICD-10-CM

## 2020-03-22 NOTE — Telephone Encounter (Signed)
Wife calls nurse line requesting a refill on patients amlodipine. Wife reports he is taking 10mg  now, has been taking (2) of the 5mg  tablets and was told to call when those ran out so we can transition to 10mg . Patient came in today for blood work. Please advise.

## 2020-03-23 LAB — BASIC METABOLIC PANEL
BUN/Creatinine Ratio: 8 — ABNORMAL LOW (ref 9–20)
BUN: 8 mg/dL (ref 6–24)
CO2: 22 mmol/L (ref 20–29)
Calcium: 9.4 mg/dL (ref 8.7–10.2)
Chloride: 105 mmol/L (ref 96–106)
Creatinine, Ser: 0.97 mg/dL (ref 0.76–1.27)
GFR calc Af Amer: 111 mL/min/{1.73_m2} (ref >59)
GFR calc non Af Amer: 96 mL/min/{1.73_m2} (ref >59)
Glucose: 91 mg/dL (ref 65–99)
Potassium: 4.2 mmol/L (ref 3.5–5.2)
Sodium: 141 mmol/L (ref 134–144)

## 2020-03-25 ENCOUNTER — Encounter: Payer: Self-pay | Admitting: Family Medicine

## 2020-03-25 MED ORDER — AMLODIPINE BESYLATE 10 MG PO TABS: 10.0000 mg | ORAL_TABLET | Freq: Every day | ORAL | 0 refills | Status: DC

## 2020-04-03 ENCOUNTER — Ambulatory Visit: Payer: Self-pay | Admitting: Family Medicine

## 2020-04-03 NOTE — Progress Notes (Deleted)
    SUBJECTIVE:   CHIEF COMPLAINT / HPI:   Covid:   HTN:   PERTINENT  PMH / PSH: ***  OBJECTIVE:   There were no vitals taken for this visit.  ***  ASSESSMENT/PLAN:   No problem-specific Assessment & Plan notes found for this encounter.     Benay Pike, MD [place]

## 2020-04-05 ENCOUNTER — Other Ambulatory Visit: Payer: Self-pay | Admitting: Family Medicine

## 2020-04-09 ENCOUNTER — Other Ambulatory Visit: Payer: Self-pay | Admitting: Family Medicine

## 2020-04-09 ENCOUNTER — Telehealth: Payer: Self-pay | Admitting: Family Medicine

## 2020-04-09 DIAGNOSIS — I10 Essential (primary) hypertension: Secondary | ICD-10-CM

## 2020-04-09 MED ORDER — LOSARTAN POTASSIUM 25 MG PO TABS: 25.0000 mg | ORAL_TABLET | Freq: Every day | ORAL | 0 refills | Status: DC

## 2020-04-09 NOTE — Telephone Encounter (Signed)
**  After Hours Call**  Patient's wife identifies him by correct DOB. Calling for losartan refill. Last BMP on 03/22/20 WNL. Sent refill to [facility] at [address]. He denies any sx.  Gladys Damme, MD [facility], PGY-2

## 2020-04-26 ENCOUNTER — Ambulatory Visit: Payer: Self-pay | Admitting: Internal Medicine

## 2020-05-08 ENCOUNTER — Other Ambulatory Visit: Payer: Self-pay | Admitting: Family Medicine

## 2020-05-08 DIAGNOSIS — I10 Essential (primary) hypertension: Secondary | ICD-10-CM

## 2020-07-01 ENCOUNTER — Other Ambulatory Visit: Payer: Self-pay | Admitting: Family Medicine

## 2020-10-04 ENCOUNTER — Other Ambulatory Visit: Payer: Self-pay | Admitting: Family Medicine

## 2020-10-05 ENCOUNTER — Other Ambulatory Visit: Payer: Self-pay | Admitting: Family Medicine

## 2020-10-05 DIAGNOSIS — I10 Essential (primary) hypertension: Secondary | ICD-10-CM

## 2020-12-31 ENCOUNTER — Other Ambulatory Visit: Payer: Self-pay | Admitting: Family Medicine

## 2021-01-08 ENCOUNTER — Other Ambulatory Visit: Payer: Self-pay

## 2021-01-08 ENCOUNTER — Encounter: Payer: Self-pay | Admitting: Nurse Practitioner

## 2021-01-08 ENCOUNTER — Ambulatory Visit (INDEPENDENT_AMBULATORY_CARE_PROVIDER_SITE_OTHER): Payer: 59 | Admitting: Nurse Practitioner

## 2021-01-08 VITALS — BP 129/81 | HR 90 | Temp 98.6°F | Ht 72.0 in | Wt 257.7 lb

## 2021-01-08 DIAGNOSIS — K115 Sialolithiasis: Secondary | ICD-10-CM

## 2021-01-08 DIAGNOSIS — Z7689 Persons encountering health services in other specified circumstances: Secondary | ICD-10-CM | POA: Insufficient documentation

## 2021-01-08 DIAGNOSIS — R079 Chest pain, unspecified: Secondary | ICD-10-CM | POA: Diagnosis not present

## 2021-01-08 DIAGNOSIS — F411 Generalized anxiety disorder: Secondary | ICD-10-CM

## 2021-01-08 DIAGNOSIS — I10 Essential (primary) hypertension: Secondary | ICD-10-CM

## 2021-01-08 DIAGNOSIS — Z0000 Encounter for general adult medical examination without abnormal findings: Secondary | ICD-10-CM

## 2021-01-08 MED ORDER — AMOXICILLIN-POT CLAVULANATE 875-125 MG PO TABS: 1.0000 | ORAL_TABLET | Freq: Two times a day (BID) | ORAL | 0 refills | Status: DC

## 2021-01-08 NOTE — Patient Instructions (Signed)
Salivary Stone  A salivary stone is a mineral deposit that builds up in the ducts that drain your salivary glands. Most salivary stones are made of calcium. When a stone forms, saliva can back up into the gland and cause painful swelling. Your salivary glands are the glands that produce saliva. You have six major salivary glands. Each gland has a duct that carries saliva into your mouth. Saliva keeps your mouth moist and breaks down the food that you eat. It also helps prevent tooth decay. Two salivary glands are located just in front of your ears (parotid). The ducts for these glands open up inside your cheeks, near your back teeth. You also have two glands under your tongue (sublingual) and two glands under your jaw (submandibular). The ducts for these glands open under your tongue. A stone can form in any salivary gland. The most common place for a salivary stone to develop is in a submandibular salivary gland. What are the causes? Salivary stones may be caused by any condition that reduces the flow of saliva. It is not known why some people form stones. What increases the risk? You are more likely to develop this condition if:  You are male.  You do not drink enough water.  You smoke.  You have any of the following: ? High blood pressure. ? Gout. ? Diabetes. What are the signs or symptoms? The main sign of a salivary gland stone is sudden swelling of a salivary gland when eating. This usually happens under the jaw on one side. Other signs and symptoms may include:  Swelling of the cheek or under the tongue when eating.  Pain in the swollen area.  Trouble chewing or swallowing.  Swelling that goes down after eating. How is this diagnosed? This condition may be diagnosed based on:  Your signs and symptoms.  A physical exam. In many cases, your health care provider will be able to feel the stone in a duct inside your mouth.  Imaging studies, such  as: ? X-rays. ? Ultrasound. ? CT scan. ? MRI. You may need to see an ear, nose, and throat specialist (ENT or otolaryngologist) for diagnosis and treatment. How is this treated? Treatment for this condition depends on the size of the stone.  A small stone that is not causing symptoms may be treated with home care.  For a stone that is large enough to cause symptoms, the treatment options may include: ? Probing and widening of the duct to allow the stone to pass. ? Inserting a thin, flexible scope (endoscope) into the duct to locate and remove the stone. ? Breaking up the stone with sound waves. ? Removing the entire salivary gland. Follow these instructions at home: To relieve discomfort  Follow these instructions every few hours: ? Suck on a lemon candy to stimulate the flow of saliva. ? Put a warm compress over the gland. ? Gently massage the gland. General instructions  Drink enough fluid to keep your urine pale yellow.  Do not use any products that contain nicotine or tobacco, such as cigarettes and e-cigarettes. If you need help quitting, ask your health care provider.  Keep all follow-up visits as told by your health care provider. This is important.   Contact a health care provider if:  You have pain and swelling in your face, jaw, or mouth after eating.  You have persistent swelling in any of these places: ? In front of your ear. ? Under your jaw. ? Inside your mouth.  Get help right away if:  You have pain and swelling in your face, jaw, or mouth, and this is getting worse.  Your pain and swelling make it hard to swallow or breathe. Summary  A salivary stone is a mineral deposit that builds up in the ducts that drain your salivary glands.  When a stone forms, saliva can back up into the gland and cause painful swelling.  Salivary stones may be caused by any condition that reduces the flow of saliva.  Treatment for this condition depends on the size of the  stone. This information is not intended to replace advice given to you by your health care provider. Make sure you discuss any questions you have with your health care provider. Document Revised: 08/30/2017 Document Reviewed: 08/30/2017 Elsevier Patient Education  2021 [place].  Salivary Gland Infection  A salivary gland infection is an infection in one or more of the glands that produce saliva. You have six major salivary glands. Each gland has a duct that carries saliva into your mouth. Saliva keeps your mouth moist and breaks down the food that you eat. It also helps prevent tooth decay. Two salivary glands are located just in front of your ears (parotid). The ducts for these glands open up inside your cheeks, near your back teeth. You also have two glands under your tongue (sublingual) and two glands under your jaw (submandibular). The ducts for these glands open under your tongue. Any salivary gland can become infected. Most infections occur in the parotid glands or submandibular glands. What are the causes? This condition may be caused by bacteria or viruses.  The bacteria that cause salivary gland infections are usually the same bacteria that normally live in your mouth. ? A stone can form in a salivary gland and block the flow of saliva. As a result, saliva backs up into the salivary gland. Bacteria may then start to grow behind the blockage and cause infection. ? Bacterial infections usually cause pain and swelling on one side of the face. Submandibular gland swelling occurs under the jaw. Parotid swelling occurs in front of the ear. ? Bacterial infections are more common in adults.  The mumps virus is the most common cause of viral salivary gland infections. However, mumps is now rare because of vaccination. ? This infection causes swelling in both parotid glands. ? Viral infections are more common in children. What increases the risk? The following factors may make you more  likely to develop a bacterial infection:  Not taking good care of your mouth and teeth (poor oral hygiene).  Smoking.  Not drinking enough water.  Having a disease that causes dry mouth and dry eyes (Mikulicz syndrome or Sjogren syndrome). A viral infection is more likely to occur in children who do not get the MMR (measles, mumps, rubella) vaccine. What are the signs or symptoms? The main sign of a salivary gland infection is a swollen salivary gland. This type of inflammation is often called sialadenitis. You may have swelling in front of your ear, under your jaw, or under your tongue. Swelling may get worse when you eat and decrease after you eat. Other signs and symptoms include:  Pain.  Tenderness.  Redness.  Dry mouth.  Bad taste in your mouth.  Difficulty chewing and swallowing.  Fever. How is this diagnosed? This condition may be diagnosed based on:  Your signs and symptoms.  A physical exam. During the exam, your health care provider will look and feel inside your mouth  to see whether a stone is blocking a salivary gland duct.  Tests, such as: ? An X-ray to check for a stone. ? An ultrasound, CT scan, or MRI to look for an abscess and to rule out other causes of swelling. ? A culture and sensitivity test. In this test, a sample of pus is taken from the salivary gland with a swab or by using a needle (aspiration). The sample is tested in a lab to determine the type of bacteria that is growing and which antibiotic medicines will work against it. You may need to see an ear, nose, and throat specialist (ENT or otolaryngologist) for diagnosis and treatment. How is this treated? Viral salivary gland infections usually clear up without treatment. Bacterial infections are usually treated with antibiotic medicine. Severe infections that cause difficulty with swallowing may be treated with an IV antibiotic in the hospital. Other treatments may include:  Probing and widening  the salivary duct to allow a stone to pass. In some cases, a thin, flexible scope (endoscope) may be inserted into the duct to find a stone and remove it.  Breaking up a stone using sound waves.  Draining an infected gland (abscess) with a needle.  Surgery to: ? Remove a stone. ? Drain pus from an abscess. ? Remove a badly infected gland. Follow these instructions at home: Medicines  Take over-the-counter and prescription medicines only as told by your health care provider.  If you were prescribed an antibiotic medicine, take it as told by your health care provider. Do not stop taking the antibiotic even if you start to feel better. To relieve discomfort  Follow these instructions every few hours: ? Suck on a lemon candy to stimulate the flow of saliva. ? Put a warm compress over the gland. ? Gently massage the gland.  Rinse your mouth with a salt-water mixture 3-4 times a day or as needed. To make a salt-water mixture, completely dissolve -1 tsp of salt in 1 cup of warm water. General instructions  Practice good oral hygiene by brushing and flossing your teeth after meals and before you go to bed.  Drink enough fluid to keep your urine pale yellow.  Do not use any products that contain nicotine or tobacco, such as cigarettes and e-cigarettes. If you need help quitting, ask your health care provider.  Keep all follow-up visits as told by your health care provider. This is important.   Contact a health care provider if:  You have pain and swelling in your face, jaw, or mouth after eating.  You have persistent swelling in any of these places: ? In front of your ear. ? Under your jaw. ? Inside your mouth. Get help right away if:  You have pain and swelling in your face, jaw, or mouth, and this is getting worse.  Your pain and swelling make it hard to swallow or breathe. Summary  A salivary gland infection is an infection in one or more of the glands that produce saliva.  You have six major salivary glands. Each gland has a duct that carries saliva into your mouth.  Any salivary gland can become infected. Most infections occur in the glands just in front of your ears (parotid glands) or the glands under your jaw (submandibular glands).  This condition may be caused by bacteria or viruses.  Salivary gland infections caused by a virus usually clear up without treatment. Bacterial infections are usually treated with antibiotic medicine. This information is not intended to replace advice  given to you by your health care provider. Make sure you discuss any questions you have with your health care provider. Document Revised: 08/18/2017 Document Reviewed: 08/18/2017 Elsevier Patient Education  2021 [employer].

## 2021-01-08 NOTE — Progress Notes (Signed)
New Patient Office Visit  Subjective:  Patient ID: Larry Phillips, male    DOB: August 28, 1975  Age: 45 y.o. MRN: [id number]  CC:  Chief Complaint  Patient presents with  . New Patient (Initial Visit)    HPI Larry Phillips presents to establish new primary care provider. He states that he has been having some intermittent episodes of chest pain. Pain is generally substernal or left sided. Most of the time, chest pain can be sharp. He states that most recent episode of chest pain was a few nights ago. States that it woke him up. He did take extra pill of both blood pressure medications and pain eventually subsided. He is not currently having chest pain. States that this has happened before. Prior ECGs were reviewed. They have shown normal sinus rhythm with t wave abnormalities which are non specific. Troponin levels have been normal. His blood pressure is well managed on current medications. He currently does not have chest pain or pressure.  States that he has been having some right cervical lymph node swelling along with salivary stones. This has been going on for                . He states that symptoms come and go. Eating makes it worse. Will improve after a stone is released. States that it is making it difficult for him to eat, as it is painful.  Of note, the patient states that he recently stopped smoking marijuana. He stopped completely last Thursday. States that he recently graduated with his associates degree and is looking for better job. He states that he has had some anxiety and difficulty sleeping since he stopped using marijuana. As a result, he has also been fatigued with decreased energy. He states that he is doing better controlling his anxiety than he initially though he would do.   Depression screen [facility] 2/9 01/08/2021 03/11/2020 07/11/2019  Decreased Interest 2 0 0  Down, Depressed, Hopeless 2 0 0  PHQ - 2 Score 4 0 0  Altered sleeping 1 1 -  Tired, decreased energy 1 0 -   Change in appetite 1 0 -  Feeling bad or failure about yourself  2 0 -  Trouble concentrating 1 0 -  Moving slowly or fidgety/restless 0 0 -  Suicidal thoughts 0 0 -  PHQ-9 Score 10 1 -  Difficult doing work/chores - Not difficult at all -    Past Medical History:  Diagnosis Date  . Hypertension     History reviewed. No pertinent surgical history.  Family History  Problem Relation Age of Onset  . COPD Mother   . Healthy Father     Social History   Socioeconomic History  . Marital status: Married    Spouse name: Not on file  . Number of children: Not on file  . Years of education: Not on file  . Highest education level: Not on file  Occupational History  . Not on file  Tobacco Use  . Smoking status: Former Smoker    Quit date: 2012    Years since quitting: 10.4  . Smokeless tobacco: Never Used  Vaping Use  . Vaping Use: Never used  Substance and Sexual Activity  . Alcohol use: Not Currently  . Drug use: Not Currently    Types: Marijuana  . Sexual activity: Yes    Partners: Female  Other Topics Concern  . Not on file  Social History Narrative  . Not on file  Social Determinants of Health   Financial Resource Strain: Not on file  Food Insecurity: Not on file  Transportation Needs: Not on file  Physical Activity: Not on file  Stress: Not on file  Social Connections: Not on file  Intimate Partner Violence: Not on file    ROS Review of Systems  Constitutional: Positive for fatigue. Negative for activity change, appetite change, chills and fever.  HENT: Negative for congestion, postnasal drip, rhinorrhea and sinus pressure.        Development of salivary stones. Get worse with eating. After release of stone, swelling and pain are both reduced   Eyes: Negative.   Respiratory: Negative for cough, chest tightness, shortness of breath and wheezing.   Cardiovascular: Positive for chest pain. Negative for palpitations.       Intermittent episodes of chest  pain.   Gastrointestinal: Negative for constipation, diarrhea, nausea and vomiting.  Endocrine: Negative for cold intolerance, heat intolerance, polydipsia and polyuria.  Musculoskeletal: Negative for arthralgias, back pain and myalgias.  Skin: Negative for rash.  Allergic/Immunologic: Positive for environmental allergies.  Neurological: Negative for dizziness, weakness and headaches.  Hematological: Positive for adenopathy.       Right sided lymph node enlargement.   Psychiatric/Behavioral: Positive for sleep disturbance. The patient is nervous/anxious.     Objective:   Today's Vitals   01/08/21 0902  BP: 129/81  Pulse: 90  Temp: 98.6 F (37 C)  SpO2: 94%  Weight: 257 lb 11.2 oz (116.9 kg)  Height: 6' (1.829 m)   Body mass index is 34.95 kg/m. Physical Exam Vitals and nursing note reviewed.  Constitutional:      Appearance: Normal appearance. He is well-developed.  HENT:     Head: Normocephalic and atraumatic.     Nose: Nose normal.     Mouth/Throat:     Mouth: Mucous membranes are moist.     Comments: There is area of swelling and redness under the tongue, right side of his mouth.  Eyes:     Extraocular Movements: Extraocular movements intact.     Conjunctiva/sclera: Conjunctivae normal.     Pupils: Pupils are equal, round, and reactive to light.  Neck:     Comments: Swelling and tenderness of right submandibular lymph node.  Cardiovascular:     Rate and Rhythm: Normal rate and regular rhythm.     Pulses: Normal pulses.     Heart sounds: Normal heart sounds.     Comments: ECG done today shows normal sinus rhythm with non specific t wave abnormality. Reviewed prior ECGs and this is unchanged from prior studies.  Pulmonary:     Effort: Pulmonary effort is normal.     Breath sounds: Normal breath sounds.  Abdominal:     Palpations: Abdomen is soft.  Musculoskeletal:        General: Normal range of motion.     Cervical back: Normal range of motion and neck supple.   Lymphadenopathy:     Cervical: Cervical adenopathy present.  Skin:    General: Skin is warm and dry.     Capillary Refill: Capillary refill takes less than 2 seconds.  Neurological:     General: No focal deficit present.     Mental Status: He is alert and oriented to person, place, and time.  Psychiatric:        Attention and Perception: Attention and perception normal.        Mood and Affect: Affect normal. Mood is anxious.  Speech: Speech normal.        Behavior: Behavior normal. Behavior is cooperative.        Thought Content: Thought content normal.        Cognition and Memory: Cognition and memory normal.        Judgment: Judgment normal.     Assessment & Plan:  1. Encounter to establish care Appointment today to establish new primary care provider.  2. Chest pain, unspecified type ECG shows normal sinus rhythm with non specific t wave abnormality. Upon review, this is unchanged from previous EKGs. Routine labs ordered today. Will reassess at next visit. Consider situational anxiety as cause for chest pain.  - EKG 12-Lead  3. Hypertension, unspecified type Stable. Continue bp medication as prescribed. Routine, fasting labs drawn at today's visit.  - CBC with Differential/Platelet - Comprehensive metabolic panel - Lipid panel - TSH  4. Salivary duct stone Start augmentin twice daily. Encouraged him to apply warm compress to inflamed lymph node to help reduce swelling and pain. Advised he take tylenol and ibuprofen as needed and as indicated to reduce pain and inflammation. Refer to ENT for further evaluation and treatment.  - amoxicillin-clavulanate (AUGMENTIN) 875-125 MG tablet; Take 1 tablet by mouth 2 (two) times daily.  Dispense: 20 tablet; Refill: 0 - Ambulatory referral to ENT  5. Generalized anxiety disorder Believed to be situational stress. Will reassess at next visit.   6. Healthcare maintenance Routine, fasting labs drawn while in office today  - CBC  with Differential/Platelet - Comprehensive metabolic panel - Lipid panel - TSH  Problem List Items Addressed This Visit      Cardiovascular and Mediastinum   Hypertension   Relevant Orders   CBC with Differential/Platelet   Comprehensive metabolic panel   Lipid panel   TSH     Digestive   Salivary duct stone   Relevant Medications   amoxicillin-clavulanate (AUGMENTIN) 875-125 MG tablet   Other Relevant Orders   Ambulatory referral to ENT     Other   Healthcare maintenance   Relevant Orders   CBC with Differential/Platelet   Comprehensive metabolic panel   Lipid panel   TSH   Encounter to establish care - Primary   Chest pain   Relevant Orders   EKG 12-Lead   Generalized anxiety disorder      Outpatient Encounter Medications as of 01/08/2021  Medication Sig  . amLODipine (NORVASC) 10 MG tablet TAKE 1 TABLET(10 MG) BY MOUTH AT BEDTIME  . amoxicillin-clavulanate (AUGMENTIN) 875-125 MG tablet Take 1 tablet by mouth 2 (two) times daily.  . famotidine (PEPCID) 20 MG tablet Take 1 tablet (20 mg total) by mouth 2 (two) times daily.  Marland Kitchen losartan (COZAAR) 25 MG tablet TAKE 1 TABLET(25 MG) BY MOUTH DAILY   No facility-administered encounter medications on file as of 01/08/2021.    Follow-up: Return in about 6 weeks (around 02/19/2021) for physical .   Ronnell Freshwater, NP

## 2021-01-09 LAB — CBC WITH DIFFERENTIAL/PLATELET
Basophils Absolute: 0 10*3/uL (ref 0.0–0.2)
Basos: 1 %
EOS (ABSOLUTE): 0.1 10*3/uL (ref 0.0–0.4)
Eos: 1 %
Hematocrit: 43 % (ref 37.5–51.0)
Hemoglobin: 14.9 g/dL (ref 13.0–17.7)
Immature Grans (Abs): 0 10*3/uL (ref 0.0–0.1)
Immature Granulocytes: 0 %
Lymphocytes Absolute: 2.6 10*3/uL (ref 0.7–3.1)
Lymphs: 46 %
MCH: 31.4 pg (ref 26.6–33.0)
MCHC: 34.7 g/dL (ref 31.5–35.7)
MCV: 91 fL (ref 79–97)
Monocytes Absolute: 0.4 10*3/uL (ref 0.1–0.9)
Monocytes: 6 %
Neutrophils Absolute: 2.6 10*3/uL (ref 1.4–7.0)
Neutrophils: 46 %
Platelets: 222 10*3/uL (ref 150–450)
RBC: 4.74 x10E6/uL (ref 4.14–5.80)
RDW: 13.4 % (ref 11.6–15.4)
WBC: 5.6 10*3/uL (ref 3.4–10.8)

## 2021-01-09 LAB — COMPREHENSIVE METABOLIC PANEL
ALT: 14 IU/L (ref 0–44)
AST: 15 IU/L (ref 0–40)
Albumin/Globulin Ratio: 1.8 (ref 1.2–2.2)
Albumin: 4.5 g/dL (ref 4.0–5.0)
Alkaline Phosphatase: 87 IU/L (ref 44–121)
BUN/Creatinine Ratio: 8 — ABNORMAL LOW (ref 9–20)
BUN: 9 mg/dL (ref 6–24)
Bilirubin Total: 1.4 mg/dL — ABNORMAL HIGH (ref 0.0–1.2)
CO2: 23 mmol/L (ref 20–29)
Calcium: 9.4 mg/dL (ref 8.7–10.2)
Chloride: 101 mmol/L (ref 96–106)
Creatinine, Ser: 1.09 mg/dL (ref 0.76–1.27)
Globulin, Total: 2.5 g/dL (ref 1.5–4.5)
Glucose: 95 mg/dL (ref 65–99)
Potassium: 3.8 mmol/L (ref 3.5–5.2)
Sodium: 140 mmol/L (ref 134–144)
Total Protein: 7 g/dL (ref 6.0–8.5)
eGFR: 86 mL/min/{1.73_m2} (ref >59)

## 2021-01-09 LAB — LIPID PANEL
Chol/HDL Ratio: 5.8 ratio — ABNORMAL HIGH (ref 0.0–5.0)
Cholesterol, Total: 168 mg/dL (ref 100–199)
HDL: 29 mg/dL — ABNORMAL LOW (ref >39)
LDL Chol Calc (NIH): 110 mg/dL — ABNORMAL HIGH (ref 0–99)
Triglycerides: 162 mg/dL — ABNORMAL HIGH (ref 0–149)
VLDL Cholesterol Cal: 29 mg/dL (ref 5–40)

## 2021-01-09 LAB — TSH: TSH: 2.09 u[IU]/mL (ref 0.450–4.500)

## 2021-01-09 NOTE — Addendum Note (Signed)
Addended by: Vivia Birmingham on: 01/09/2021 04:00 PM   Modules accepted: Orders

## 2021-01-10 NOTE — Progress Notes (Signed)
Reviewed with patient during visit

## 2021-02-10 ENCOUNTER — Other Ambulatory Visit: Payer: Self-pay | Admitting: Family Medicine

## 2021-02-18 ENCOUNTER — Ambulatory Visit (INDEPENDENT_AMBULATORY_CARE_PROVIDER_SITE_OTHER): Payer: 59 | Admitting: Otolaryngology

## 2021-02-19 ENCOUNTER — Encounter: Payer: Self-pay | Admitting: Nurse Practitioner

## 2021-04-08 ENCOUNTER — Ambulatory Visit (INDEPENDENT_AMBULATORY_CARE_PROVIDER_SITE_OTHER): Payer: 59 | Admitting: Otolaryngology

## 2021-04-08 ENCOUNTER — Other Ambulatory Visit: Payer: Self-pay

## 2021-04-08 DIAGNOSIS — K115 Sialolithiasis: Secondary | ICD-10-CM

## 2021-04-08 NOTE — Progress Notes (Signed)
HPI: Larry Phillips is a 45 y.o. male who presents is referred by his PCP for evaluation of recurrent right submandibular salivary duct stones that have caused recurrent swelling and infection of the right submandibular gland..  He apparently has had intermittent swelling of the right submandibular gland for several years and has extruded several stones from the submandibular duct on the right side.  More recently had a bad submandibular gland infection about 4 months ago that required antibiotics and was very painful.  He presents today to discuss possible excision of the submandibular gland.  Past Medical History:  Diagnosis Date   Hypertension    No past surgical history on file. Social History   Socioeconomic History   Marital status: Married    Spouse name: Not on file   Number of children: Not on file   Years of education: Not on file   Highest education level: Not on file  Occupational History   Not on file  Tobacco Use   Smoking status: Former    Types: Cigarettes    Quit date: 2012    Years since quitting: 10.6   Smokeless tobacco: Never  Vaping Use   Vaping Use: Never used  Substance and Sexual Activity   Alcohol use: Not Currently   Drug use: Not Currently    Types: Marijuana   Sexual activity: Yes    Partners: Female  Other Topics Concern   Not on file  Social History Narrative   Not on file   Social Determinants of Health   Financial Resource Strain: Not on file  Food Insecurity: Not on file  Transportation Needs: Not on file  Physical Activity: Not on file  Stress: Not on file  Social Connections: Not on file   Family History  Problem Relation Age of Onset   COPD Mother    Healthy Father    No Known Allergies Prior to Admission medications   Medication Sig Start Date End Date Taking? Authorizing Provider  amLODipine (NORVASC) 10 MG tablet TAKE 1 TABLET(10 MG) BY MOUTH AT BEDTIME 02/11/21   Ronnell Freshwater, NP  amoxicillin-clavulanate (AUGMENTIN)  875-125 MG tablet Take 1 tablet by mouth 2 (two) times daily. 01/08/21   Ronnell Freshwater, NP  famotidine (PEPCID) 20 MG tablet Take 1 tablet (20 mg total) by mouth 2 (two) times daily. 09/24/19   Isla Pence, MD  losartan (COZAAR) 25 MG tablet TAKE 1 TABLET(25 MG) BY MOUTH DAILY 10/07/20   Benay Pike, MD     Positive ROS: Otherwise negative  All other systems have been reviewed and were otherwise negative with the exception of those mentioned in the HPI and as above.  Physical Exam: Constitutional: Alert, well-appearing, no acute distress Ears: External ears without lesions or tenderness. Ear canals are clear bilaterally with intact, clear TMs.  Nasal: External nose without lesions. Septum with mild deformity and mild rhinitis. Clear nasal passages otherwise. Oral: Lips and gums without lesions. Tongue and palate mucosa without lesions. Posterior oropharynx clear.  On examination of the floor of the mouth drainage from the submandibular duct is clear.  On palpation of the submandibular duct he does have some thickening or possible small stone in the distal submandibular duct.  On palpation of the submandibular gland the right 7 of the gland is much firmer than the left.  But not painful. Neck: No palpable adenopathy or masses.  He has a firm right submandibular gland to palpation compared to the left side.  No significant  adenopathy in the neck otherwise. Respiratory: Breathing comfortably Lungs clear to auscultation Cardiac: Regular rate and rhythm without murmur Skin: No facial/neck lesions or rash noted.  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  Plan: I discussed with the patient concerning observation versus removal of the right submandibula Gland.. He would like to go ahead and have the submandibular gland removed. We will plan on scheduling this in about a month as he has just started a new  job.   Radene Journey, MD   CC:

## 2021-04-12 ENCOUNTER — Other Ambulatory Visit: Payer: Self-pay | Admitting: Family Medicine

## 2021-04-12 DIAGNOSIS — I10 Essential (primary) hypertension: Secondary | ICD-10-CM

## 2021-04-14 ENCOUNTER — Other Ambulatory Visit: Payer: Self-pay | Admitting: Nurse Practitioner

## 2021-04-14 DIAGNOSIS — I10 Essential (primary) hypertension: Secondary | ICD-10-CM

## 2021-04-14 MED ORDER — LOSARTAN POTASSIUM 25 MG PO TABS: ORAL_TABLET | ORAL | 1 refills | Status: DC

## 2021-04-14 NOTE — Progress Notes (Signed)
Refilled losartan '25mg'$  and sent to [facility]

## 2021-04-14 NOTE — Telephone Encounter (Signed)
Refilled losartan '25mg'$  and sent to [facility] 04/14/2021

## 2021-04-16 ENCOUNTER — Ambulatory Visit (INDEPENDENT_AMBULATORY_CARE_PROVIDER_SITE_OTHER): Payer: 59 | Admitting: Nurse Practitioner

## 2021-04-16 ENCOUNTER — Encounter: Payer: Self-pay | Admitting: Nurse Practitioner

## 2021-04-16 ENCOUNTER — Other Ambulatory Visit: Payer: Self-pay

## 2021-04-16 VITALS — BP 115/70 | HR 72 | Temp 98.1°F | Ht 72.0 in | Wt 272.2 lb

## 2021-04-16 DIAGNOSIS — M25562 Pain in left knee: Secondary | ICD-10-CM

## 2021-04-16 DIAGNOSIS — Z6836 Body mass index (BMI) 36.0-36.9, adult: Secondary | ICD-10-CM

## 2021-04-16 DIAGNOSIS — E782 Mixed hyperlipidemia: Secondary | ICD-10-CM | POA: Diagnosis not present

## 2021-04-16 DIAGNOSIS — Z0001 Encounter for general adult medical examination with abnormal findings: Secondary | ICD-10-CM

## 2021-04-16 DIAGNOSIS — I10 Essential (primary) hypertension: Secondary | ICD-10-CM | POA: Diagnosis not present

## 2021-04-16 DIAGNOSIS — G8929 Other chronic pain: Secondary | ICD-10-CM

## 2021-04-16 NOTE — Progress Notes (Signed)
Established Patient Office Visit  Subjective:  Patient ID: Larry Phillips, male    DOB: [birth year]  Age: 45 y.o. MRN: [id number]  CC:  Chief Complaint  Patient presents with   Annual Exam    HPI Larry Phillips presents for annual wellness visit.  His blood pressure is well controlled on current medications.  He did have routine, fasting labs done prior to this visit.  His cholesterol did show mild, generalized elevation.  Lipid Panel     Component Value Date/Time   CHOL 168 01/08/2021 1013   TRIG 162 (H) 01/08/2021 1013   HDL 29 (L) 01/08/2021 1013   CHOLHDL 5.8 (H) 01/08/2021 1013   LDLCALC 110 (H) 01/08/2021 1013   LABVLDL 29 01/08/2021 1013    Plan to discuss diet and lifestyle changes necessary to lower cholesterol without adding additional medications at this time. He does report chronic left knee pain.  Hurts mostly along the outer aspect of the left knee.  Pain is more severe with exertion such as going up and down stairs and bending at the knees.  Does not bother him too much generalized. Patient denies other concerns or complaints.  He denies chest pain, chest pressure, or shortness of breath. He denies headaches or visual disturbances. He denies abdominal pain, nausea, vomiting, or changes in bowel or bladder habits.    Past Medical History:  Diagnosis Date   Hypertension     History reviewed. No pertinent surgical history.  Family History  Problem Relation Age of Onset   COPD Mother    Healthy Father     Social History   Socioeconomic History   Marital status: Married    Spouse name: Not on file   Number of children: Not on file   Years of education: Not on file   Highest education level: Not on file  Occupational History   Not on file  Tobacco Use   Smoking status: Former    Types: Cigarettes    Quit date: 2012    Years since quitting: 10.7   Smokeless tobacco: Never  Vaping Use   Vaping Use: Never used  Substance and Sexual Activity    Alcohol use: Not Currently   Drug use: Not Currently    Types: Marijuana   Sexual activity: Yes    Partners: Female  Other Topics Concern   Not on file  Social History Narrative   Not on file   Social Determinants of Health   Financial Resource Strain: Not on file  Food Insecurity: Not on file  Transportation Needs: Not on file  Physical Activity: Not on file  Stress: Not on file  Social Connections: Not on file  Intimate Partner Violence: Not on file    Outpatient Medications Prior to Visit  Medication Sig Dispense Refill   amLODipine (NORVASC) 10 MG tablet TAKE 1 TABLET(10 MG) BY MOUTH AT BEDTIME 90 tablet 1   losartan (COZAAR) 25 MG tablet TAKE 1 TABLET(25 MG) BY MOUTH DAILY 90 tablet 1   amoxicillin-clavulanate (AUGMENTIN) 875-125 MG tablet Take 1 tablet by mouth 2 (two) times daily. 20 tablet 0   famotidine (PEPCID) 20 MG tablet Take 1 tablet (20 mg total) by mouth 2 (two) times daily. 30 tablet 0   No facility-administered medications prior to visit.    No Known Allergies  ROS Review of Systems  Constitutional:  Negative for activity change, chills, fatigue and fever.  HENT:  Negative for congestion, postnasal drip, rhinorrhea, sinus pressure, sinus pain,  sneezing and sore throat.   Eyes: Negative.   Respiratory:  Negative for cough, shortness of breath and wheezing.   Cardiovascular:  Negative for chest pain and palpitations.       Well-managed blood pressure.  Gastrointestinal:  Negative for constipation, diarrhea, nausea and vomiting.  Endocrine: Negative for cold intolerance, heat intolerance, polydipsia and polyuria.  Genitourinary:  Negative for dysuria, frequency and urgency.  Musculoskeletal:  Positive for arthralgias and joint swelling. Negative for back pain and myalgias.       Left knee pain present.  This is mostly along the outer aspect of the left knee.  Worse with exertion.  Skin:  Negative for rash.  Allergic/Immunologic: Negative for environmental  allergies.  Neurological:  Negative for dizziness, weakness and headaches.  Psychiatric/Behavioral:  The patient is not nervous/anxious.      Objective:    Physical Exam Vitals and nursing note reviewed.  Constitutional:      Appearance: Normal appearance. He is well-developed. He is obese.  HENT:     Head: Normocephalic and atraumatic.     Right Ear: Tympanic membrane, ear canal and external ear normal.     Left Ear: Tympanic membrane, ear canal and external ear normal.     Nose: Nose normal.     Mouth/Throat:     Mouth: Mucous membranes are moist.     Pharynx: Oropharynx is clear.  Eyes:     Extraocular Movements: Extraocular movements intact.     Conjunctiva/sclera: Conjunctivae normal.     Pupils: Pupils are equal, round, and reactive to light.  Neck:     Vascular: No carotid bruit.  Cardiovascular:     Rate and Rhythm: Normal rate and regular rhythm.     Pulses: Normal pulses.     Heart sounds: Normal heart sounds.  Pulmonary:     Effort: Pulmonary effort is normal.     Breath sounds: Normal breath sounds.  Abdominal:     General: Bowel sounds are normal.     Palpations: Abdomen is soft.     Tenderness: There is no abdominal tenderness.  Musculoskeletal:        General: Tenderness present. Normal range of motion.     Cervical back: Normal range of motion and neck supple.     Comments: Tenderness of the left knee along the lateral aspect.  Some mild swelling is present.  Crepitus can be felt with flexion extension of the left knee.  Range of motion and strength intact.  No bony abnormalities can be felt or palpated at this time.  Lymphadenopathy:     Cervical: No cervical adenopathy.  Skin:    General: Skin is warm and dry.     Capillary Refill: Capillary refill takes less than 2 seconds.  Neurological:     General: No focal deficit present.     Mental Status: He is alert and oriented to person, place, and time.  Psychiatric:        Mood and Affect: Mood normal.         Behavior: Behavior normal.        Thought Content: Thought content normal.        Judgment: Judgment normal.    Today's Vitals   04/16/21 1332  BP: 115/70  Pulse: 72  Temp: 98.1 F (36.7 C)  SpO2: 98%  Weight: 272 lb 3.2 oz (123.5 kg)  Height: 6' (1.829 m)   Body mass index is 36.92 kg/m.   Wt Readings from Last 3 Encounters:  04/16/21 272 lb 3.2 oz (123.5 kg)  01/08/21 257 lb 11.2 oz (116.9 kg)  03/11/20 255 lb (115.7 kg)     Health Maintenance Due  Topic Date Due   TETANUS/TDAP  Never done    There are no preventive care reminders to display for this patient.  Lab Results  Component Value Date   TSH 2.090 01/08/2021   Lab Results  Component Value Date   WBC 5.6 01/08/2021   HGB 14.9 01/08/2021   HCT 43.0 01/08/2021   MCV 91 01/08/2021   PLT 222 01/08/2021   Lab Results  Component Value Date   NA 140 01/08/2021   K 3.8 01/08/2021   CO2 23 01/08/2021   GLUCOSE 95 01/08/2021   BUN 9 01/08/2021   CREATININE 1.09 01/08/2021   BILITOT 1.4 (H) 01/08/2021   ALKPHOS 87 01/08/2021   AST 15 01/08/2021   ALT 14 01/08/2021   PROT 7.0 01/08/2021   ALBUMIN 4.5 01/08/2021   CALCIUM 9.4 01/08/2021   ANIONGAP 11 03/07/2020   EGFR 86 01/08/2021   Lab Results  Component Value Date   CHOL 168 01/08/2021   Lab Results  Component Value Date   HDL 29 (L) 01/08/2021   Lab Results  Component Value Date   LDLCALC 110 (H) 01/08/2021   Lab Results  Component Value Date   TRIG 162 (H) 01/08/2021   Lab Results  Component Value Date   CHOLHDL 5.8 (H) 01/08/2021   Lab Results  Component Value Date   HGBA1C 5.2 07/11/2019      Assessment & Plan:  1. Encounter for general adult medical examination with abnormal findings Annual wellness visit today.  2. Hypertension, unspecified type Blood pressure well controlled.  Continue medications as prescribed.  3. Mixed hyperlipidemia Reviewed labs showing mild, generalized elevation of lipid panel.   Discussed low-fat, low-cholesterol diet to help lower lipid panel.  Recheck in 1 year.  4. Chronic pain of left knee Pain of left knee going on for extended period of time.  We will get x-ray of left knee for further evaluation.  Recommend he rest, ice, compress, and elevate his left leg when possible.  Recommend he take Tylenol and or ibuprofen for pain and inflammation.  Consider referral to orthopedics as indicated. - DG Knee 1-2 Views Left; Future  5. Body mass index (BMI) of 36.0-36.9 in adult Recommended patient keep calorie intake to 2000 cal/day.  Diet should consist of low-fat low-cholesterol diet.  Recommend he gradually incorporate exercise into his daily routine.  Problem List Items Addressed This Visit       Cardiovascular and Mediastinum   Hypertension     Other   Encounter for general adult medical examination with abnormal findings - Primary   Mixed hyperlipidemia   Chronic pain of left knee   Relevant Orders   DG Knee 1-2 Views Left   Other Visit Diagnoses     Body mass index (BMI) of 36.0-36.9 in adult           This note was dictated using Dragon Voice Recognition Software. Rapid proofreading was performed to expedite the delivery of the information. Despite proofreading, phonetic errors will occur which are common with this voice recognition software. Please take this into consideration. If there are any concerns, please contact our office.     Follow-up: Return in about 1 year (around 04/16/2022) for health maintenance exam, FBW a week prior to visit.    Ronnell Freshwater, NP

## 2021-04-22 ENCOUNTER — Ambulatory Visit: Payer: 59

## 2021-04-25 ENCOUNTER — Encounter: Payer: Self-pay | Admitting: Nurse Practitioner

## 2021-04-27 DIAGNOSIS — M25561 Pain in right knee: Secondary | ICD-10-CM | POA: Insufficient documentation

## 2021-04-27 DIAGNOSIS — E782 Mixed hyperlipidemia: Secondary | ICD-10-CM | POA: Insufficient documentation

## 2021-04-27 DIAGNOSIS — Z0001 Encounter for general adult medical examination with abnormal findings: Secondary | ICD-10-CM | POA: Insufficient documentation

## 2021-04-27 DIAGNOSIS — G8929 Other chronic pain: Secondary | ICD-10-CM | POA: Insufficient documentation

## 2021-04-27 NOTE — Patient Instructions (Signed)

## 2021-04-28 ENCOUNTER — Other Ambulatory Visit: Payer: Self-pay | Admitting: Nurse Practitioner

## 2021-04-28 DIAGNOSIS — N529 Male erectile dysfunction, unspecified: Secondary | ICD-10-CM

## 2021-04-28 MED ORDER — SILDENAFIL CITRATE 50 MG PO TABS: 50.0000 mg | ORAL_TABLET | Freq: Every day | ORAL | 2 refills | Status: DC | PRN

## 2021-04-28 NOTE — Progress Notes (Signed)
Sildenifil 50mg   tablets ordered. New prescription sent to [facility] on [address] in [place], [place].

## 2021-05-01 ENCOUNTER — Ambulatory Visit: Payer: 59

## 2021-05-05 ENCOUNTER — Encounter (HOSPITAL_BASED_OUTPATIENT_CLINIC_OR_DEPARTMENT_OTHER): Payer: Self-pay | Admitting: Otolaryngology

## 2021-05-05 ENCOUNTER — Other Ambulatory Visit: Payer: Self-pay

## 2021-05-06 ENCOUNTER — Ambulatory Visit (INDEPENDENT_AMBULATORY_CARE_PROVIDER_SITE_OTHER): Payer: Self-pay | Admitting: Otolaryngology

## 2021-05-06 DIAGNOSIS — K1120 Sialoadenitis, unspecified: Secondary | ICD-10-CM

## 2021-05-06 NOTE — H&P (Signed)
PREOPERATIVE H&P  Chief Complaint: Recurrent right submandibular gland infection  HPI: Larry Phillips is a 45 y.o. male who presents for evaluation of chronic right submandibular gland swelling with history of submandibular stones that have extruded.  This has been going on for several years.  More recently had a severe right submandibular gland infection that lasted for over a week.  He presents today to have the right submandibular gland removed.  Past Medical History:  Diagnosis Date   Hypertension    No past surgical history on file. Social History   Socioeconomic History   Marital status: Married    Spouse name: Not on file   Number of children: Not on file   Years of education: Not on file   Highest education level: Not on file  Occupational History   Not on file  Tobacco Use   Smoking status: Former    Types: Cigarettes    Quit date: 2012    Years since quitting: 10.7   Smokeless tobacco: Never  Vaping Use   Vaping Use: Never used  Substance and Sexual Activity   Alcohol use: Not Currently   Drug use: Not Currently    Types: Marijuana   Sexual activity: Yes    Partners: Female  Other Topics Concern   Not on file  Social History Narrative   Not on file   Social Determinants of Health   Financial Resource Strain: Not on file  Food Insecurity: Not on file  Transportation Needs: Not on file  Physical Activity: Not on file  Stress: Not on file  Social Connections: Not on file   Family History  Problem Relation Age of Onset   COPD Mother    Healthy Father    No Known Allergies Prior to Admission medications   Medication Sig Start Date End Date Taking? Authorizing Provider  amLODipine (NORVASC) 10 MG tablet TAKE 1 TABLET(10 MG) BY MOUTH AT BEDTIME 02/11/21   Ronnell Freshwater, NP  losartan (COZAAR) 25 MG tablet TAKE 1 TABLET(25 MG) BY MOUTH DAILY 04/14/21   Ronnell Freshwater, NP  sildenafil (VIAGRA) 50 MG tablet Take 1 tablet (50 mg total) by mouth daily as  needed for erectile dysfunction. 04/28/21   Ronnell Freshwater, NP     Positive ROS: Otherwise negative  All other systems have been reviewed and were otherwise negative with the exception of those mentioned in the HPI and as above.  Physical Exam: There were no vitals filed for this visit.  General: Alert, no acute distress Oral: Normal oral mucosa and tonsils Nasal: Clear nasal passages Neck: Right submandibular gland is slightly enlarged and firm to palpation.  Drainage from the submandibular duct is presently clear.  No palpable adenopathy lower in the neck Ear: Ear canal is clear with normal appearing TMs Cardiovascular: Regular rate and rhythm, no murmur.  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  Plan for excision of right submandibular gland.   Melony Overly, MD 05/06/2021 6:05 PM

## 2021-05-06 NOTE — H&P (View-Only) (Signed)
PREOPERATIVE H&P  Chief Complaint: Recurrent right submandibular gland infection  HPI: Larry Phillips is a 45 y.o. male who presents for evaluation of chronic right submandibular gland swelling with history of submandibular stones that have extruded.  This has been going on for several years.  More recently had a severe right submandibular gland infection that lasted for over a week.  He presents today to have the right submandibular gland removed.  Past Medical History:  Diagnosis Date   Hypertension    No past surgical history on file. Social History   Socioeconomic History   Marital status: Married    Spouse name: Not on file   Number of children: Not on file   Years of education: Not on file   Highest education level: Not on file  Occupational History   Not on file  Tobacco Use   Smoking status: Former    Types: Cigarettes    Quit date: 2012    Years since quitting: 10.7   Smokeless tobacco: Never  Vaping Use   Vaping Use: Never used  Substance and Sexual Activity   Alcohol use: Not Currently   Drug use: Not Currently    Types: Marijuana   Sexual activity: Yes    Partners: Female  Other Topics Concern   Not on file  Social History Narrative   Not on file   Social Determinants of Health   Financial Resource Strain: Not on file  Food Insecurity: Not on file  Transportation Needs: Not on file  Physical Activity: Not on file  Stress: Not on file  Social Connections: Not on file   Family History  Problem Relation Age of Onset   COPD Mother    Healthy Father    No Known Allergies Prior to Admission medications   Medication Sig Start Date End Date Taking? Authorizing Provider  amLODipine (NORVASC) 10 MG tablet TAKE 1 TABLET(10 MG) BY MOUTH AT BEDTIME 02/11/21   Ronnell Freshwater, NP  losartan (COZAAR) 25 MG tablet TAKE 1 TABLET(25 MG) BY MOUTH DAILY 04/14/21   Ronnell Freshwater, NP  sildenafil (VIAGRA) 50 MG tablet Take 1 tablet (50 mg total) by mouth daily as  needed for erectile dysfunction. 04/28/21   Ronnell Freshwater, NP     Positive ROS: Otherwise negative  All other systems have been reviewed and were otherwise negative with the exception of those mentioned in the HPI and as above.  Physical Exam: There were no vitals filed for this visit.  General: Alert, no acute distress Oral: Normal oral mucosa and tonsils Nasal: Clear nasal passages Neck: Right submandibular gland is slightly enlarged and firm to palpation.  Drainage from the submandibular duct is presently clear.  No palpable adenopathy lower in the neck Ear: Ear canal is clear with normal appearing TMs Cardiovascular: Regular rate and rhythm, no murmur.  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  Plan for excision of right submandibular gland.   Melony Overly, MD 05/06/2021 6:05 PM

## 2021-05-11 NOTE — Anesthesia Preprocedure Evaluation (Addendum)
Anesthesia Evaluation  Patient identified by MRN, date of birth, ID band Patient awake    Reviewed: Allergy & Precautions, H&P , NPO status , Patient's Chart, lab work & pertinent test results  Airway Mallampati: II  TM Distance: >3 FB Neck ROM: Full    Dental no notable dental hx. (+) Teeth Intact, Poor Dentition, Chipped, Dental Advisory Given,    Pulmonary neg pulmonary ROS, former smoker,    Pulmonary exam normal breath sounds clear to auscultation       Cardiovascular Exercise Tolerance: Good hypertension, Pt. on medications Normal cardiovascular exam Rhythm:Regular Rate:Normal     Neuro/Psych PSYCHIATRIC DISORDERS Anxiety negative neurological ROS  negative psych ROS   GI/Hepatic negative GI ROS, Neg liver ROS,   Endo/Other  negative endocrine ROS  Renal/GU negative Renal ROS  negative genitourinary   Musculoskeletal negative musculoskeletal ROS (+)   Abdominal   Peds negative pediatric ROS (+)  Hematology negative hematology ROS (+)   Anesthesia Other Findings   Reproductive/Obstetrics negative OB ROS                            Anesthesia Physical Anesthesia Plan  ASA: 2  Anesthesia Plan: General   Post-op Pain Management:    Induction: Intravenous  PONV Risk Score and Plan: 2 and Ondansetron and Dexamethasone  Airway Management Planned: Oral ETT and LMA  Additional Equipment: None  Intra-op Plan:   Post-operative Plan: Extubation in OR  Informed Consent: I have reviewed the patients History and Physical, chart, labs and discussed the procedure including the risks, benefits and alternatives for the proposed anesthesia with the patient or authorized representative who has indicated his/her understanding and acceptance.       Plan Discussed with: Anesthesiologist and CRNA  Anesthesia Plan Comments: (  )        Anesthesia Quick Evaluation

## 2021-05-12 ENCOUNTER — Ambulatory Visit (HOSPITAL_BASED_OUTPATIENT_CLINIC_OR_DEPARTMENT_OTHER): Payer: 59 | Admitting: Anesthesiology

## 2021-05-12 ENCOUNTER — Other Ambulatory Visit: Payer: Self-pay

## 2021-05-12 ENCOUNTER — Encounter (HOSPITAL_BASED_OUTPATIENT_CLINIC_OR_DEPARTMENT_OTHER)
Admission: RE | Disposition: A | Discharge status: Home / Self Care | Payer: Self-pay | Source: Home / Self Care | Attending: Otolaryngology

## 2021-05-12 ENCOUNTER — Encounter (HOSPITAL_BASED_OUTPATIENT_CLINIC_OR_DEPARTMENT_OTHER): Payer: Self-pay | Admitting: Otolaryngology

## 2021-05-12 ENCOUNTER — Ambulatory Visit (HOSPITAL_BASED_OUTPATIENT_CLINIC_OR_DEPARTMENT_OTHER)
Admission: RE | Admit: 2021-05-12 | Discharge: 2021-05-12 | Disposition: A | Discharge status: Home / Self Care | Payer: 59 | Attending: Otolaryngology | Admitting: Otolaryngology

## 2021-05-12 DIAGNOSIS — Z87891 Personal history of nicotine dependence: Secondary | ICD-10-CM | POA: Diagnosis not present

## 2021-05-12 DIAGNOSIS — K1123 Chronic sialoadenitis: Secondary | ICD-10-CM | POA: Insufficient documentation

## 2021-05-12 DIAGNOSIS — I10 Essential (primary) hypertension: Secondary | ICD-10-CM | POA: Diagnosis not present

## 2021-05-12 DIAGNOSIS — K1120 Sialoadenitis, unspecified: Secondary | ICD-10-CM

## 2021-05-12 DIAGNOSIS — K115 Sialolithiasis: Secondary | ICD-10-CM | POA: Diagnosis not present

## 2021-05-12 HISTORY — PX: SUBMANDIBULAR GLAND EXCISION: SHX2456

## 2021-05-12 SURGERY — EXCISION, SUBMANDIBULAR GLAND
Anesthesia: General | Site: Neck | Laterality: Right

## 2021-05-12 MED ORDER — LACTATED RINGERS IV SOLN: INTRAVENOUS | Status: DC

## 2021-05-12 MED ORDER — DEXAMETHASONE SODIUM PHOSPHATE 10 MG/ML IJ SOLN
INTRAMUSCULAR | Status: AC
  Filled 2021-05-12: qty 1

## 2021-05-12 MED ORDER — PHENYLEPHRINE 40 MCG/ML (10ML) SYRINGE FOR IV PUSH (FOR BLOOD PRESSURE SUPPORT)
PREFILLED_SYRINGE | INTRAVENOUS | Status: AC
  Filled 2021-05-12: qty 10

## 2021-05-12 MED ORDER — CEFAZOLIN SODIUM-DEXTROSE 1-4 GM/50ML-% IV SOLN
INTRAVENOUS | Status: AC
  Filled 2021-05-12: qty 50

## 2021-05-12 MED ORDER — 0.9 % SODIUM CHLORIDE (POUR BTL) OPTIME
TOPICAL | Status: DC | PRN
  Administered 2021-05-12: 90 mL

## 2021-05-12 MED ORDER — LIDOCAINE 2% (20 MG/ML) 5 ML SYRINGE
INTRAMUSCULAR | Status: DC | PRN
  Administered 2021-05-12: 100 mg via INTRAVENOUS

## 2021-05-12 MED ORDER — FENTANYL CITRATE (PF) 100 MCG/2ML IJ SOLN
INTRAMUSCULAR | Status: AC
  Filled 2021-05-12: qty 2

## 2021-05-12 MED ORDER — LIDOCAINE-EPINEPHRINE 1 %-1:100000 IJ SOLN
INTRAMUSCULAR | Status: DC | PRN
  Administered 2021-05-12: 5 mL

## 2021-05-12 MED ORDER — ACETAMINOPHEN 160 MG/5ML PO SOLN: 325.0000 mg | ORAL | Status: DC | PRN

## 2021-05-12 MED ORDER — SUGAMMADEX SODIUM 200 MG/2ML IV SOLN
INTRAVENOUS | Status: DC | PRN
  Administered 2021-05-12: 300 mg via INTRAVENOUS

## 2021-05-12 MED ORDER — ONDANSETRON HCL 4 MG/2ML IJ SOLN
INTRAMUSCULAR | Status: AC
  Filled 2021-05-12: qty 2

## 2021-05-12 MED ORDER — LIDOCAINE-EPINEPHRINE 1 %-1:100000 IJ SOLN
INTRAMUSCULAR | Status: AC
  Filled 2021-05-12: qty 1

## 2021-05-12 MED ORDER — MIDAZOLAM HCL 2 MG/2ML IJ SOLN
INTRAMUSCULAR | Status: AC
  Filled 2021-05-12: qty 2

## 2021-05-12 MED ORDER — MIDAZOLAM HCL 5 MG/5ML IJ SOLN
INTRAMUSCULAR | Status: DC | PRN
  Administered 2021-05-12: 2 mg via INTRAVENOUS

## 2021-05-12 MED ORDER — OXYCODONE HCL 5 MG PO TABS
ORAL_TABLET | ORAL | Status: AC
  Filled 2021-05-12: qty 1

## 2021-05-12 MED ORDER — CHLORHEXIDINE GLUCONATE CLOTH 2 % EX PADS
6.0000 | MEDICATED_PAD | Freq: Once | CUTANEOUS | Status: DC
Start: 2021-05-12 — End: 2021-05-12

## 2021-05-12 MED ORDER — CEPHALEXIN 500 MG PO CAPS: 500.0000 mg | ORAL_CAPSULE | Freq: Two times a day (BID) | ORAL | 0 refills | Status: DC

## 2021-05-12 MED ORDER — PROPOFOL 10 MG/ML IV BOLUS
INTRAVENOUS | Status: DC | PRN
Start: 2021-05-12 — End: 2021-05-12
  Administered 2021-05-12: 200 mg via INTRAVENOUS

## 2021-05-12 MED ORDER — LABETALOL HCL 5 MG/ML IV SOLN
INTRAVENOUS | Status: AC
  Filled 2021-05-12: qty 4

## 2021-05-12 MED ORDER — PHENYLEPHRINE HCL (PRESSORS) 10 MG/ML IV SOLN
INTRAVENOUS | Status: DC | PRN
  Administered 2021-05-12 (×4): 80 ug via INTRAVENOUS

## 2021-05-12 MED ORDER — FENTANYL CITRATE (PF) 100 MCG/2ML IJ SOLN
INTRAMUSCULAR | Status: DC | PRN
  Administered 2021-05-12 (×4): 50 ug via INTRAVENOUS

## 2021-05-12 MED ORDER — DEXAMETHASONE SODIUM PHOSPHATE 10 MG/ML IJ SOLN
INTRAMUSCULAR | Status: DC | PRN
  Administered 2021-05-12: 10 mg via INTRAVENOUS

## 2021-05-12 MED ORDER — CHLORHEXIDINE GLUCONATE CLOTH 2 % EX PADS: 6.0000 | MEDICATED_PAD | Freq: Once | CUTANEOUS | Status: DC

## 2021-05-12 MED ORDER — FENTANYL CITRATE (PF) 100 MCG/2ML IJ SOLN
25.0000 ug | INTRAMUSCULAR | Status: DC | PRN
  Administered 2021-05-12: 25 ug via INTRAVENOUS

## 2021-05-12 MED ORDER — OXYCODONE HCL 5 MG/5ML PO SOLN: 5.0000 mg | Freq: Once | ORAL | Status: AC | PRN

## 2021-05-12 MED ORDER — ROCURONIUM BROMIDE 100 MG/10ML IV SOLN
INTRAVENOUS | Status: DC | PRN
  Administered 2021-05-12: 80 mg via INTRAVENOUS

## 2021-05-12 MED ORDER — LABETALOL HCL 5 MG/ML IV SOLN
INTRAVENOUS | Status: DC | PRN
  Administered 2021-05-12: 5 mg via INTRAVENOUS

## 2021-05-12 MED ORDER — ONDANSETRON HCL 4 MG/2ML IJ SOLN
INTRAMUSCULAR | Status: DC | PRN
  Administered 2021-05-12: 4 mg via INTRAVENOUS

## 2021-05-12 MED ORDER — ONDANSETRON HCL 4 MG/2ML IJ SOLN: 4.0000 mg | Freq: Once | INTRAMUSCULAR | Status: DC | PRN

## 2021-05-12 MED ORDER — OXYCODONE HCL 5 MG PO TABS
5.0000 mg | ORAL_TABLET | Freq: Once | ORAL | Status: AC | PRN
  Administered 2021-05-12: 5 mg via ORAL

## 2021-05-12 MED ORDER — ROCURONIUM BROMIDE 10 MG/ML (PF) SYRINGE
PREFILLED_SYRINGE | INTRAVENOUS | Status: AC
  Filled 2021-05-12: qty 10

## 2021-05-12 MED ORDER — PROPOFOL 10 MG/ML IV BOLUS
INTRAVENOUS | Status: AC
  Filled 2021-05-12: qty 40

## 2021-05-12 MED ORDER — LIDOCAINE 2% (20 MG/ML) 5 ML SYRINGE
INTRAMUSCULAR | Status: AC
  Filled 2021-05-12: qty 5

## 2021-05-12 MED ORDER — MEPERIDINE HCL 25 MG/ML IJ SOLN: 6.2500 mg | INTRAMUSCULAR | Status: DC | PRN

## 2021-05-12 MED ORDER — ACETAMINOPHEN 325 MG PO TABS: 325.0000 mg | ORAL_TABLET | ORAL | Status: DC | PRN

## 2021-05-12 MED ORDER — CEFAZOLIN SODIUM-DEXTROSE 2-4 GM/100ML-% IV SOLN
2.0000 g | INTRAVENOUS | Status: AC
  Administered 2021-05-12: 3 g via INTRAVENOUS

## 2021-05-12 MED ORDER — SUGAMMADEX SODIUM 500 MG/5ML IV SOLN
INTRAVENOUS | Status: AC
  Filled 2021-05-12: qty 5

## 2021-05-12 MED ORDER — CEFAZOLIN SODIUM-DEXTROSE 2-4 GM/100ML-% IV SOLN
INTRAVENOUS | Status: AC
  Filled 2021-05-12: qty 100

## 2021-05-12 SURGICAL SUPPLY — 59 items
ADH SKN CLS APL DERMABOND .7 (GAUZE/BANDAGES/DRESSINGS) ×1
APL SKNCLS STERI-STRIP NONHPOA (GAUZE/BANDAGES/DRESSINGS)
ATTRACTOMAT 16X20 MAGNETIC DRP (DRAPES) ×1 IMPLANT
BENZOIN TINCTURE PRP APPL 2/3 (GAUZE/BANDAGES/DRESSINGS) IMPLANT
BLADE CLIPPER SURG (BLADE) ×1 IMPLANT
BLADE SURG 15 STRL LF DISP TIS (BLADE) ×1 IMPLANT
BLADE SURG 15 STRL SS (BLADE) ×2
CANISTER SUCT 1200ML W/VALVE (MISCELLANEOUS) ×2 IMPLANT
CLEANER CAUTERY TIP 5X5 PAD (MISCELLANEOUS) IMPLANT
CORD BIPOLAR FORCEPS 12FT (ELECTRODE) ×1 IMPLANT
COVER BACK TABLE 60X90IN (DRAPES) ×2 IMPLANT
COVER MAYO STAND STRL (DRAPES) ×2 IMPLANT
DECANTER SPIKE VIAL GLASS SM (MISCELLANEOUS) IMPLANT
DERMABOND ADVANCED (GAUZE/BANDAGES/DRESSINGS) ×1
DERMABOND ADVANCED .7 DNX12 (GAUZE/BANDAGES/DRESSINGS) IMPLANT
DRAPE U-SHAPE 76X120 STRL (DRAPES) ×2 IMPLANT
ELECT COATED BLADE 2.86 ST (ELECTRODE) ×2 IMPLANT
ELECT REM PT RETURN 9FT ADLT (ELECTROSURGICAL) ×2
ELECTRODE REM PT RTRN 9FT ADLT (ELECTROSURGICAL) ×1 IMPLANT
FORCEPS BIPOLAR SPETZLER 8 1.0 (NEUROSURGERY SUPPLIES) IMPLANT
GAUZE 4X4 16PLY ~~LOC~~+RFID DBL (SPONGE) ×1 IMPLANT
GAUZE SPONGE 4X4 12PLY STRL LF (GAUZE/BANDAGES/DRESSINGS) IMPLANT
GLOVE SURG MICRO LTX SZ7.5 (GLOVE) ×2 IMPLANT
GLOVE SURG UNDER POLY LF SZ7 (GLOVE) ×2 IMPLANT
GOWN STRL REUS W/ TWL LRG LVL3 (GOWN DISPOSABLE) ×1 IMPLANT
GOWN STRL REUS W/TWL LRG LVL3 (GOWN DISPOSABLE) ×2
HEMOSTAT SURGICEL .5X2 ABSORB (HEMOSTASIS) IMPLANT
LOCATOR NERVE 3 VOLT (DISPOSABLE) IMPLANT
NDL HYPO 25X1 1.5 SAFETY (NEEDLE) ×1 IMPLANT
NEEDLE HYPO 25X1 1.5 SAFETY (NEEDLE) ×2 IMPLANT
NS IRRIG 1000ML POUR BTL (IV SOLUTION) ×2 IMPLANT
PACK BASIN DAY SURGERY FS (CUSTOM PROCEDURE TRAY) ×2 IMPLANT
PAD CLEANER CAUTERY TIP 5X5 (MISCELLANEOUS)
PENCIL SMOKE EVACUATOR (MISCELLANEOUS) ×2 IMPLANT
SLEEVE SCD COMPRESS KNEE MED (STOCKING) ×2 IMPLANT
SPONGE INTESTINAL PEANUT (DISPOSABLE) ×1 IMPLANT
STRIP CLOSURE SKIN 1/2X4 (GAUZE/BANDAGES/DRESSINGS) IMPLANT
STRIP CLOSURE SKIN 1/4X4 (GAUZE/BANDAGES/DRESSINGS) IMPLANT
SUCTION FRAZIER HANDLE 10FR (MISCELLANEOUS) ×2
SUCTION TUBE FRAZIER 10FR DISP (MISCELLANEOUS) IMPLANT
SUT CHROMIC 3 0 PS 2 (SUTURE) ×2 IMPLANT
SUT CHROMIC 3 0 SH 27 (SUTURE) IMPLANT
SUT ETHILON 4 0 PS 2 18 (SUTURE) IMPLANT
SUT ETHILON 5 0 P 3 18 (SUTURE) ×2
SUT NYLON ETHILON 5-0 P-3 1X18 (SUTURE) ×1 IMPLANT
SUT SILK 2 0 TIES 17X18 (SUTURE) ×2
SUT SILK 2-0 18XBRD TIE BLK (SUTURE) IMPLANT
SUT SILK 3 0 SH 30 (SUTURE) IMPLANT
SUT SILK 3 0 TIES 17X18 (SUTURE) ×2
SUT SILK 3-0 18XBRD TIE BLK (SUTURE) ×1 IMPLANT
SUT VIC AB 5-0 P-3 18X BRD (SUTURE) IMPLANT
SUT VIC AB 5-0 P3 18 (SUTURE) ×2
SWAB COLLECTION DEVICE MRSA (MISCELLANEOUS) IMPLANT
SWAB CULTURE ESWAB REG 1ML (MISCELLANEOUS) IMPLANT
SYR BULB EAR ULCER 3OZ GRN STR (SYRINGE) ×2 IMPLANT
SYR CONTROL 10ML LL (SYRINGE) ×2 IMPLANT
TOWEL GREEN STERILE FF (TOWEL DISPOSABLE) ×4 IMPLANT
TRAY DSU PREP LF (CUSTOM PROCEDURE TRAY) ×2 IMPLANT
TUBE CONNECTING 20X1/4 (TUBING) ×2 IMPLANT

## 2021-05-12 NOTE — Op Note (Signed)
Larry Phillips, [facility] MEDICAL RECORD NO: [id number] ACCOUNT NO: [id number] DATE OF BIRTH: 14-Apr-1976 FACILITY: [facility] LOCATION: [facility] PHYSICIAN: Leonides Sake. Lucia Gaskins, MD  Operative Report   DATE OF PROCEDURE: 05/12/2021   PREOPERATIVE DIAGNOSIS:  Recurrent and chronic right submandibular gland infection.  POSTOPERATIVE DIAGNOSIS:  Recurrent and chronic right submandibular gland infection.  PROCEDURE:  Excision of right submandibular gland.  SURGEON:  Dr. Melony Overly.  ANESTHESIA:  General endotracheal.  BLOOD LOSS:  20 mL.  COMPLICATIONS:  None.  BRIEF CLINICAL NOTE: This is a 45 year old gentleman who has been having recurrent right submandibular gland swelling for several years.  He has had a couple of bad infections this year and would like the gland removed.  On exam, the patient has a firm,  slightly enlarged right submandibular gland.  He states that he has had some calculi extruded from the submandibular duct in the past.  On palpation in the office, he appears to have a proximal large calculus within the proximal duct of the gland.  He  was taken to the operating room staff for excision of right submandibular gland.  DESCRIPTION OF PROCEDURE:  After adequate endotracheal anesthesia, the patient received 2 grams Ancef IV preoperatively.  A shoulder roll was used to extend the neck.  The area was prepped with Betadine solution and draped out in sterile towels.  An  incision was made in the inferior margin of the submandibular gland about two fingerbreadths below the edge of the mandible.  Dissection was carried down through the subcutaneous tissue with cautery.  Platysma muscle was divided and the gland was  identified.  Dissection around the gland was performed.  The facial vein and artery were identified posteriorly and branches ligated to the gland.  The gland was then dissected out of the submandibular fossa.  There was slight scar tissue around the  gland.   The duct of the gland was identified.  It was slightly dilated.  Lingual nerve was identified and the branch to the gland was identified.  This was clamped and ligated as was as the duct clamped and ligated with a 2-0 silk suture.  Some of the  sublingual gland was also removed as this was adherent to the submandibular gland.  Hemostasis was obtained with a bipolar cautery.  The wound was irrigated with saline and closed with 3-0 chromic suture subcutaneously, reapproximating this muscle.   Then, a 5-0 Vicryl subcuticular stitch followed by Dermabond.  The patient was awoken from anesthesia and transferred to recovery room.  Postop doing well.  DISPOSITION:  The patient will be discharged home early this morning on Keflex 500 mg b.i.d. for 5 days, Tylenol and ibuprofen p.r.n. pain.  He will follow up in my office in one week for recheck.    Of note, the gland was sent to pathology.     [email or web address] D: 05/12/2021 10:11:27 am T: 05/12/2021 10:55:00 am  JOB: [id number]/ [id number]

## 2021-05-12 NOTE — Anesthesia Postprocedure Evaluation (Signed)
Anesthesia Post Note  Patient: Larry Phillips  Procedure(s) Performed: EXCISION RIGHT SUBMANDIBULAR GLAND (Right: Neck)     Patient location during evaluation: PACU Anesthesia Type: General Level of consciousness: awake and alert Pain management: pain level controlled Vital Signs Assessment: post-procedure vital signs reviewed and stable Respiratory status: spontaneous breathing, nonlabored ventilation, respiratory function stable and patient connected to nasal cannula oxygen Cardiovascular status: blood pressure returned to baseline and stable Postop Assessment: no apparent nausea or vomiting Anesthetic complications: no   No notable events documented.  Last Vitals:  Vitals:   05/12/21 1045 05/12/21 1050  BP:    Pulse:  67  Resp:  15  Temp:    SpO2: 93% 93%    Last Pain:  Vitals:   05/12/21 1050  TempSrc:   PainSc: Asleep                 Myrick Mcnairy

## 2021-05-12 NOTE — Discharge Instructions (Addendum)
Keep incision site dry for 24 hours. Can apply cool compress to the excision site to reduce swelling. Take Tylenol or ibuprofen as needed pain. Take Keflex 500 mg twice daily for the next 5 days. Call office for follow-up appointment in 1 week [phone]. Call office if he have any problems or questions.   Post Anesthesia Home Care Instructions  Activity: Get plenty of rest for the remainder of the day. A responsible individual must stay with you for 24 hours following the procedure.  For the next 24 hours, DO NOT: -Drive a car -Paediatric nurse -Drink alcoholic beverages -Take any medication unless instructed by your physician -Make any legal decisions or sign important papers.  Meals: Start with liquid foods such as gelatin or soup. Progress to regular foods as tolerated. Avoid greasy, spicy, heavy foods. If nausea and/or vomiting occur, drink only clear liquids until the nausea and/or vomiting subsides. Call your physician if vomiting continues.  Special Instructions/Symptoms: Your throat may feel dry or sore from the anesthesia or the breathing tube placed in your throat during surgery. If this causes discomfort, gargle with warm salt water. The discomfort should disappear within 24 hours.  If you had a scopolamine patch placed behind your ear for the management of post- operative nausea and/or vomiting:  1. The medication in the patch is effective for 72 hours, after which it should be removed.  Wrap patch in a tissue and discard in the trash. Wash hands thoroughly with soap and water. 2. You may remove the patch earlier than 72 hours if you experience unpleasant side effects which may include dry mouth, dizziness or visual disturbances. 3. Avoid touching the patch. Wash your hands with soap and water after contact with the patch.

## 2021-05-12 NOTE — Interval H&P Note (Signed)
History and Physical Interval Note:  05/12/2021 7:34 AM  Larry Phillips  has presented today for surgery, with the diagnosis of CHRONIC SUBMANDIBULAR GLAND INFECTION.  The various methods of treatment have been discussed with the patient and family. After consideration of risks, benefits and other options for treatment, the patient has consented to  Procedure(s): EXCISION SUBMANDIBULAR GLAND (Right) as a surgical intervention.  The patient's history has been reviewed, patient examined, no change in status, stable for surgery.  I have reviewed the patient's chart and labs.  Questions were answered to the patient's satisfaction.     Melony Overly

## 2021-05-12 NOTE — Anesthesia Procedure Notes (Signed)
Procedure Name: Intubation Date/Time: 05/12/2021 7:44 AM Performed by: Lavonia Dana, CRNA Pre-anesthesia Checklist: Patient identified, Emergency Drugs available, Suction available and Patient being monitored Patient Re-evaluated:Patient Re-evaluated prior to induction Oxygen Delivery Method: Circle system utilized Preoxygenation: Pre-oxygenation with 100% oxygen Induction Type: IV induction Ventilation: Mask ventilation without difficulty and Oral airway inserted - appropriate to patient size Laryngoscope Size: Mac and 4 Grade View: Grade I Tube type: Oral Tube size: 7.5 mm Number of attempts: 1 Airway Equipment and Method: Stylet and Oral airway Placement Confirmation: ETT inserted through vocal cords under direct vision, positive ETCO2 and breath sounds checked- equal and bilateral Secured at: 25 cm Tube secured with: Tape Dental Injury: Teeth and Oropharynx as per pre-operative assessment

## 2021-05-12 NOTE — Brief Op Note (Signed)
05/12/2021  10:01 AM  PATIENT:  Larry Phillips  45 y.o. male  PRE-OPERATIVE DIAGNOSIS:  CHRONIC SUBMANDIBULAR GLAND INFECTION  POST-OPERATIVE DIAGNOSIS:  CHRONIC SUBMANDIBULAR GLAND INFECTION  PROCEDURE:  Procedure(s): EXCISION RIGHT SUBMANDIBULAR GLAND (Right)  SURGEON:  Surgeon(s) and Role:    Rozetta Nunnery, MD - Primary  PHYSICIAN ASSISTANT:   ASSISTANTS: none   ANESTHESIA:   general  EBL:  20 mL   BLOOD ADMINISTERED:none  DRAINS: none   LOCAL MEDICATIONS USED:  XYLOCAINE   SPECIMEN:  Source of Specimen:  right submandibular gland  DISPOSITION OF SPECIMEN:  PATHOLOGY  COUNTS:  YES  TOURNIQUET:  * No tourniquets in log *  DICTATION: .Other Dictation: Dictation Number [id number]  PLAN OF CARE: Discharge to home after PACU  PATIENT DISPOSITION:  PACU - hemodynamically stable.   Delay start of Pharmacological VTE agent (>24hrs) due to surgical blood loss or risk of bleeding: yes

## 2021-05-12 NOTE — Transfer of Care (Signed)
Immediate Anesthesia Transfer of Care Note  Patient: Larry Phillips  Procedure(s) Performed: EXCISION RIGHT SUBMANDIBULAR GLAND (Right: Neck)  Patient Location: PACU  Anesthesia Type:General  Level of Consciousness: drowsy  Airway & Oxygen Therapy: Patient Spontanous Breathing and Patient connected to face mask oxygen  Post-op Assessment: Report given to RN and Post -op Vital signs reviewed and stable  Post vital signs: Reviewed and stable  Last Vitals:  Vitals Value Taken Time  BP 141/94 05/12/21 1015  Temp    Pulse 83 05/12/21 1017  Resp 0 05/12/21 1017  SpO2 95 % 05/12/21 1017  Vitals shown include unvalidated device data.  Last Pain:  Vitals:   05/12/21 1499  TempSrc: Oral  PainSc: 0-No pain         Complications: No notable events documented.

## 2021-05-13 ENCOUNTER — Encounter (HOSPITAL_BASED_OUTPATIENT_CLINIC_OR_DEPARTMENT_OTHER): Payer: Self-pay | Admitting: Otolaryngology

## 2021-05-13 LAB — SURGICAL PATHOLOGY

## 2021-05-19 ENCOUNTER — Other Ambulatory Visit: Payer: Self-pay

## 2021-05-19 ENCOUNTER — Ambulatory Visit (INDEPENDENT_AMBULATORY_CARE_PROVIDER_SITE_OTHER): Payer: 59 | Admitting: Otolaryngology

## 2021-05-19 DIAGNOSIS — Z4889 Encounter for other specified surgical aftercare: Secondary | ICD-10-CM

## 2021-05-19 NOTE — Progress Notes (Signed)
HPI: Larry Phillips is a 45 y.o. male who presents 7 days s/p excision of right submandibular gland.  He is doing well but complains of some pain in his tongue and when he chews..   Past Medical History:  Diagnosis Date   Hypertension    Past Surgical History:  Procedure Laterality Date   SUBMANDIBULAR GLAND EXCISION Right 05/12/2021   Procedure: EXCISION RIGHT SUBMANDIBULAR GLAND;  Surgeon: Rozetta Nunnery, MD;  Location: [place];  Service: ENT;  Laterality: Right;   Social History   Socioeconomic History   Marital status: Married    Spouse name: Not on file   Number of children: Not on file   Years of education: Not on file   Highest education level: Not on file  Occupational History   Not on file  Tobacco Use   Smoking status: Former    Types: Cigarettes    Quit date: 2012    Years since quitting: 10.8   Smokeless tobacco: Never  Vaping Use   Vaping Use: Never used  Substance and Sexual Activity   Alcohol use: Not Currently   Drug use: Not Currently    Types: Marijuana   Sexual activity: Yes    Partners: Female  Other Topics Concern   Not on file  Social History Narrative   Not on file   Social Determinants of Health   Financial Resource Strain: Not on file  Food Insecurity: Not on file  Transportation Needs: Not on file  Physical Activity: Not on file  Stress: Not on file  Social Connections: Not on file   Family History  Problem Relation Age of Onset   COPD Mother    Healthy Father    No Known Allergies Prior to Admission medications   Medication Sig Start Date End Date Taking? Authorizing Provider  amLODipine (NORVASC) 10 MG tablet TAKE 1 TABLET(10 MG) BY MOUTH AT BEDTIME 02/11/21   Ronnell Freshwater, NP  cephALEXin (KEFLEX) 500 MG capsule Take 1 capsule (500 mg total) by mouth 2 (two) times daily. 05/12/21   Rozetta Nunnery, MD  losartan (COZAAR) 25 MG tablet TAKE 1 TABLET(25 MG) BY MOUTH DAILY 04/14/21   Ronnell Freshwater, NP  sildenafil (VIAGRA) 50 MG tablet Take 1 tablet (50 mg total) by mouth daily as needed for erectile dysfunction. 04/28/21   Ronnell Freshwater, NP     Physical Exam: He has minimal swelling in the region of the submandibular gland with no clinical evidence of cellulitis.  Intraoral exam was clear with palpation of the right floor mouth being soft.   Assessment: S/p excision of right submandibular gland.  Plan: I discussed with him that the pain will gradually improve over the next week.  If he has any increasing pain or swelling I gave him a prescription for Keflex to take 500 mg 3 times daily for 5 days. 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   Radene Journey, MD

## 2021-05-20 ENCOUNTER — Encounter (INDEPENDENT_AMBULATORY_CARE_PROVIDER_SITE_OTHER): Payer: 59 | Admitting: Otolaryngology

## 2021-06-12 ENCOUNTER — Ambulatory Visit (INDEPENDENT_AMBULATORY_CARE_PROVIDER_SITE_OTHER): Payer: 59 | Admitting: Podiatry

## 2021-06-12 ENCOUNTER — Other Ambulatory Visit: Payer: Self-pay

## 2021-06-12 ENCOUNTER — Ambulatory Visit (INDEPENDENT_AMBULATORY_CARE_PROVIDER_SITE_OTHER): Payer: 59

## 2021-06-12 DIAGNOSIS — M79671 Pain in right foot: Secondary | ICD-10-CM

## 2021-06-12 DIAGNOSIS — M7751 Other enthesopathy of right foot: Secondary | ICD-10-CM | POA: Diagnosis not present

## 2021-06-13 NOTE — Progress Notes (Signed)
Subjective:  Patient ID: Larry Phillips, male    DOB: [birth year],  MRN: [id number]  No chief complaint on file.   45 y.o. male presents with the above complaint.  Patient presents with complaint of right first MPJ pain that has been going on for years and is progressive gotten worse.  Patient does a lot of work on his foot.  He states that he does not have to wear steel toe boots but he does have to wear nonslip resistant shoes.  He would like to discuss treatment options for he has not seen anyone else prior to seeing me.  The pain is dull achy in nature right in the first metatarsophalangeal joint.  Pain on palpation to the joint pain with ambulation.  He has not tried any treatment factors he has not seen anyone else prior to seeing me.   Review of Systems: Negative except as noted in the HPI. Denies N/V/F/Ch.  Past Medical History:  Diagnosis Date   Hypertension     Current Outpatient Medications:    amLODipine (NORVASC) 10 MG tablet, TAKE 1 TABLET(10 MG) BY MOUTH AT BEDTIME, Disp: 90 tablet, Rfl: 1   cephALEXin (KEFLEX) 500 MG capsule, Take 1 capsule (500 mg total) by mouth 2 (two) times daily., Disp: 10 capsule, Rfl: 0   losartan (COZAAR) 25 MG tablet, TAKE 1 TABLET(25 MG) BY MOUTH DAILY, Disp: 90 tablet, Rfl: 1   sildenafil (VIAGRA) 50 MG tablet, Take 1 tablet (50 mg total) by mouth daily as needed for erectile dysfunction., Disp: 10 tablet, Rfl: 2  Social History   Tobacco Use  Smoking Status Former   Types: Cigarettes   Quit date: 2012   Years since quitting: 10.8  Smokeless Tobacco Never    No Known Allergies Objective:  There were no vitals filed for this visit. There is no height or weight on file to calculate BMI. Constitutional Well developed. Well nourished.  Vascular Dorsalis pedis pulses palpable bilaterally. Posterior tibial pulses palpable bilaterally. Capillary refill normal to all digits.  No cyanosis or clubbing noted. Pedal hair growth normal.   Neurologic Normal speech. Oriented to person, place, and time. Epicritic sensation to light touch grossly present bilaterally.  Dermatologic Nails well groomed and normal in appearance. No open wounds. No skin lesions.  Orthopedic: Pain on palpation of right first metatarsophalangeal joint.  Pain with range of motion of the first MPJ.  Deep intra-articular first MPJ pain noted.  No crepitus clinically appreciated.  No pain with range of motion of the second metatarsophalangeal joint.  No pain at the sesamoidal complex.   Radiographs: 3 views of skeletally mature the right foot: Uneven joint space narrowing noted.  Patient may also have a bone cyst formation under the subchondral bone.  He may have an osteochondral defect of the MPJ. Assessment:   1. Capsulitis of metatarsophalangeal (MTP) joint of right foot    Plan:  Patient was evaluated and treated and all questions answered.  Right first MPJ capsulitis with a possible underlying osteochondral defect -I explained to the patient the etiology of capsulitis and various treatment options were extensively discussed.  Given the amount of pain that he is having I believe patient will benefit from steroid injection help decrease acute inflammatory component associate with pain.  Patient agrees with plan would like to proceed with steroid injection -A steroid injection was performed at right first MPJ using 1% plain Lidocaine and 10 mg of Kenalog. This was well tolerated. -If there is no improvement  we will discuss MRI evaluation.   No follow-ups on file.

## 2021-07-14 ENCOUNTER — Other Ambulatory Visit: Payer: Self-pay | Admitting: Nurse Practitioner

## 2021-07-14 DIAGNOSIS — I10 Essential (primary) hypertension: Secondary | ICD-10-CM

## 2021-07-14 MED ORDER — LOSARTAN POTASSIUM 25 MG PO TABS: ORAL_TABLET | ORAL | 1 refills | Status: DC

## 2021-07-15 ENCOUNTER — Other Ambulatory Visit: Payer: Self-pay

## 2021-07-15 ENCOUNTER — Ambulatory Visit (INDEPENDENT_AMBULATORY_CARE_PROVIDER_SITE_OTHER): Payer: 59

## 2021-07-15 ENCOUNTER — Ambulatory Visit: Payer: 59 | Admitting: Podiatry

## 2021-07-15 DIAGNOSIS — M205X1 Other deformities of toe(s) (acquired), right foot: Secondary | ICD-10-CM | POA: Diagnosis not present

## 2021-07-15 DIAGNOSIS — M7751 Other enthesopathy of right foot: Secondary | ICD-10-CM

## 2021-07-16 ENCOUNTER — Ambulatory Visit (INDEPENDENT_AMBULATORY_CARE_PROVIDER_SITE_OTHER): Payer: 59 | Admitting: Nurse Practitioner

## 2021-07-16 ENCOUNTER — Encounter: Payer: Self-pay | Admitting: Nurse Practitioner

## 2021-07-16 VITALS — BP 116/75 | HR 83 | Temp 98.0°F | Ht 72.0 in | Wt 269.4 lb

## 2021-07-16 DIAGNOSIS — Z6830 Body mass index (BMI) 30.0-30.9, adult: Secondary | ICD-10-CM | POA: Insufficient documentation

## 2021-07-16 DIAGNOSIS — I10 Essential (primary) hypertension: Secondary | ICD-10-CM | POA: Diagnosis not present

## 2021-07-16 DIAGNOSIS — G8929 Other chronic pain: Secondary | ICD-10-CM

## 2021-07-16 DIAGNOSIS — M25561 Pain in right knee: Secondary | ICD-10-CM | POA: Diagnosis not present

## 2021-07-16 DIAGNOSIS — M25562 Pain in left knee: Secondary | ICD-10-CM | POA: Diagnosis not present

## 2021-07-16 NOTE — Patient Instructions (Signed)
Fat and Cholesterol Restricted Eating Plan Getting too much fat and cholesterol in your diet may cause health problems. Choosing the right foods helps keep your fat and cholesterol at normal levels. This can keep you from getting certain diseases. Your doctor may recommend an eating plan that includes: Total fat: ______% or less of total calories a day. This is ______g of fat a day. Saturated fat: ______% or less of total calories a day. This is ______g of saturated fat a day. Cholesterol: less than _________mg a day. Fiber: ______g a day. What are tips for following this plan? General tips Work with your doctor to lose weight if you need to. Avoid: Foods with added sugar. Fried foods. Foods with trans fat or partially hydrogenated oils. This includes some margarines and baked goods. If you drink alcohol: Limit how much you have to: 0-1 drink a day for women who are not pregnant. 0-2 drinks a day for men. Know how much alcohol is in a drink. In the [place] one drink equals one 12 oz bottle of beer (355 mL), one 5 oz glass of wine (148 mL), or one 1 oz glass of hard liquor (44 mL). Reading food labels Check food labels for: Trans fats. Partially hydrogenated oils. Saturated fat (g) in each serving. Cholesterol (mg) in each serving. Fiber (g) in each serving. Choose foods with healthy fats, such as: Monounsaturated fats and polyunsaturated fats. These include olive and canola oil, flaxseeds, walnuts, almonds, and seeds. Omega-3 fats. These are found in certain fish, flaxseed oil, and ground flaxseeds. Choose grain products that have whole grains. Look for the word "whole" as the first word in the ingredient list. Cooking Cook foods using low-fat methods. These include baking, boiling, grilling, and broiling. Eat more home-cooked foods. Eat at restaurants and buffets less often. Eat less fast food. Avoid cooking using saturated fats, such as butter, cream, palm oil, palm kernel oil, and  coconut oil. Meal planning  At meals, divide your plate into four equal parts: Fill one-half of your plate with vegetables, green salads, and fruit. Fill one-fourth of your plate with whole grains. Fill one-fourth of your plate with low-fat (lean) protein foods. Eat fish that is high in omega-3 fats at least two times a week. This includes mackerel, tuna, sardines, and salmon. Eat foods that are high in fiber, such as whole grains, beans, apples, pears, berries, broccoli, carrots, peas, and barley. What foods should I eat? Fruits All fresh, canned (in natural juice), or frozen fruits. Vegetables Fresh or frozen vegetables (raw, steamed, roasted, or grilled). Green salads. Grains Whole grains, such as whole wheat or whole grain breads, crackers, cereals, and pasta. Unsweetened oatmeal, bulgur, barley, quinoa, or brown rice. Corn or whole wheat flour tortillas. Meats and other protein foods Ground beef (85% or leaner), grass-fed beef, or beef trimmed of fat. Skinless chicken or turkey. Ground chicken or turkey. Pork trimmed of fat. All fish and seafood. Egg whites. Dried beans, peas, or lentils. Unsalted nuts or seeds. Unsalted canned beans. Nut butters without added sugar or oil. Dairy Low-fat or nonfat dairy products, such as skim or 1% milk, 2% or reduced-fat cheeses, low-fat and fat-free ricotta or cottage cheese, or plain low-fat and nonfat yogurt. Fats and oils Tub margarine without trans fats. Light or reduced-fat mayonnaise and salad dressings. Avocado. Olive, canola, sesame, or safflower oils. The items listed above may not be a complete list of foods and beverages you can eat. Contact a dietitian for more information. What foods   should I avoid? Fruits Canned fruit in heavy syrup. Fruit in cream or butter sauce. Fried fruit. Vegetables Vegetables cooked in cheese, cream, or butter sauce. Fried vegetables. Grains White bread. White pasta. White rice. Cornbread. Bagels, pastries,  and croissants. Crackers and snack foods that contain trans fat and hydrogenated oils. Meats and other protein foods Fatty cuts of meat. Ribs, chicken wings, bacon, sausage, bologna, salami, chitterlings, fatback, hot dogs, bratwurst, and packaged lunch meats. Liver and organ meats. Whole eggs and egg yolks. Chicken and turkey with skin. Fried meat. Dairy Whole or 2% milk, cream, half-and-half, and cream cheese. Whole milk cheeses. Whole-fat or sweetened yogurt. Full-fat cheeses. Nondairy creamers and whipped toppings. Processed cheese, cheese spreads, and cheese curds. Fats and oils Butter, stick margarine, lard, shortening, ghee, or bacon fat. Coconut, palm kernel, and palm oils. Beverages Alcohol. Sugar-sweetened drinks such as sodas, lemonade, and fruit drinks. Sweets and desserts Corn syrup, sugars, honey, and molasses. Candy. Jam and jelly. Syrup. Sweetened cereals. Cookies, pies, cakes, donuts, muffins, and ice cream. The items listed above may not be a complete list of foods and beverages you should avoid. Contact a dietitian for more information. Summary Choosing the right foods helps keep your fat and cholesterol at normal levels. This can keep you from getting certain diseases. At meals, fill one-half of your plate with vegetables, green salads, and fruits. Eat high fiber foods, like whole grains, beans, apples, pears, berries, carrots, peas, and barley. Limit added sugar, saturated fats, alcohol, and fried foods. This information is not intended to replace advice given to you by your health care provider. Make sure you discuss any questions you have with your health care provider. Document Revised: 11/29/2020 Document Reviewed: 11/29/2020 Elsevier Patient Education  2022 [employer] Inc.  

## 2021-07-16 NOTE — Progress Notes (Signed)
Established Patient Office Visit  Subjective:  Patient ID: Larry Phillips, male    DOB: 07/22/1976  Age: 45 y.o. MRN: [id number]  CC:  Chief Complaint  Patient presents with   Knee Pain     HPI Larry Phillips presents for evaluation of knee pain. He hs been having this knee pain for several months, but has been intermittent for years. He states that pain is now consistent. Bending at the knee increases pain. Going up and down stairs makes the pain worse. Standing up from a seated position causes increased pain. Pain is along the medial aspect of the knee and also on anterior part of the patella. Sometimes this is also effecting the right knee.  Along the right knee, pain is more severe along the lateral aspect.  He does not take anything for pain. He states that if it gets very bad during the day, his wife will make him take ibuprofen.   Past Medical History:  Diagnosis Date   Hypertension     Past Surgical History:  Procedure Laterality Date   SUBMANDIBULAR GLAND EXCISION Right 05/12/2021   Procedure: EXCISION RIGHT SUBMANDIBULAR GLAND;  Surgeon: Rozetta Nunnery, MD;  Location: [place];  Service: ENT;  Laterality: Right;    Family History  Problem Relation Age of Onset   COPD Mother    Healthy Father     Social History   Socioeconomic History   Marital status: Married    Spouse name: Not on file   Number of children: Not on file   Years of education: Not on file   Highest education level: Not on file  Occupational History   Not on file  Tobacco Use   Smoking status: Former    Types: Cigarettes    Quit date: 2012    Years since quitting: 10.9   Smokeless tobacco: Never  Vaping Use   Vaping Use: Never used  Substance and Sexual Activity   Alcohol use: Not Currently   Drug use: Not Currently    Types: Marijuana   Sexual activity: Yes    Partners: Female  Other Topics Concern   Not on file  Social History Narrative   Not on file    Social Determinants of Health   Financial Resource Strain: Not on file  Food Insecurity: Not on file  Transportation Needs: Not on file  Physical Activity: Not on file  Stress: Not on file  Social Connections: Not on file  Intimate Partner Violence: Not on file    Outpatient Medications Prior to Visit  Medication Sig Dispense Refill   amLODipine (NORVASC) 10 MG tablet TAKE 1 TABLET(10 MG) BY MOUTH AT BEDTIME 90 tablet 1   losartan (COZAAR) 25 MG tablet TAKE 1 TABLET(25 MG) BY MOUTH DAILY 90 tablet 1   sildenafil (VIAGRA) 50 MG tablet Take 1 tablet (50 mg total) by mouth daily as needed for erectile dysfunction. 10 tablet 2   cephALEXin (KEFLEX) 500 MG capsule Take 1 capsule (500 mg total) by mouth 2 (two) times daily. (Patient not taking: Reported on 07/16/2021) 10 capsule 0   No facility-administered medications prior to visit.    No Known Allergies  ROS Review of Systems  Constitutional:  Positive for activity change. Negative for chills, fatigue and fever.  HENT:  Negative for congestion, postnasal drip, rhinorrhea, sinus pressure, sinus pain, sneezing and sore throat.   Eyes: Negative.   Respiratory:  Negative for cough, shortness of breath and wheezing.  Cardiovascular:  Negative for chest pain and palpitations.  Gastrointestinal:  Negative for constipation, diarrhea, nausea and vomiting.  Endocrine: Negative for cold intolerance, heat intolerance, polydipsia and polyuria.  Genitourinary:  Negative for dysuria, frequency and urgency.  Musculoskeletal:  Positive for arthralgias. Negative for back pain and myalgias.       Patient is having pain in both knees.  Pain is severe and most consistent the left side.  Pain is along the medial and anterior part of the left knee.  Hurts more when standing up from seated position.  Also hurts when going up and down stairs.  Right side, pain is mostly on the outer aspect and anterior aspect into knee.  Again standing from seated  position and going up and down stairs makes the pain more severe.  Skin:  Negative for rash.  Allergic/Immunologic: Negative for environmental allergies.  Neurological:  Negative for dizziness, weakness and headaches.  Psychiatric/Behavioral:  The patient is not nervous/anxious.      Objective:    Physical Exam Vitals and nursing note reviewed.  Constitutional:      Appearance: Normal appearance. He is well-developed.  HENT:     Head: Normocephalic and atraumatic.     Nose: Nose normal.     Mouth/Throat:     Mouth: Mucous membranes are moist.  Eyes:     Extraocular Movements: Extraocular movements intact.     Conjunctiva/sclera: Conjunctivae normal.     Pupils: Pupils are equal, round, and reactive to light.  Cardiovascular:     Rate and Rhythm: Normal rate and regular rhythm.     Pulses: Normal pulses.     Heart sounds: Normal heart sounds.  Pulmonary:     Effort: Pulmonary effort is normal.     Breath sounds: Normal breath sounds.  Abdominal:     Palpations: Abdomen is soft.  Musculoskeletal:        General: Normal range of motion.     Cervical back: Normal range of motion and neck supple.       Legs:  Lymphadenopathy:     Cervical: No cervical adenopathy.  Skin:    General: Skin is warm and dry.     Capillary Refill: Capillary refill takes less than 2 seconds.  Neurological:     General: No focal deficit present.     Mental Status: He is alert and oriented to person, place, and time.  Psychiatric:        Mood and Affect: Mood normal.        Behavior: Behavior normal.        Thought Content: Thought content normal.        Judgment: Judgment normal.    Today's Vitals   07/16/21 1111  BP: 116/75  Pulse: 83  Temp: 98 F (36.7 C)  SpO2: 98%  Weight: 269 lb 6.4 oz (122.2 kg)  Height: 6' (1.829 m)   Body mass index is 36.54 kg/m.   Wt Readings from Last 3 Encounters:  07/16/21 269 lb 6.4 oz (122.2 kg)  05/12/21 271 lb 13.2 oz (123.3 kg)  04/16/21 272 lb  3.2 oz (123.5 kg)     Health Maintenance Due  Topic Date Due   COVID-19 Vaccine (1) Never done   TETANUS/TDAP  Never done    There are no preventive care reminders to display for this patient.  Lab Results  Component Value Date   TSH 2.090 01/08/2021   Lab Results  Component Value Date   WBC 5.6 01/08/2021  HGB 14.9 01/08/2021   HCT 43.0 01/08/2021   MCV 91 01/08/2021   PLT 222 01/08/2021   Lab Results  Component Value Date   NA 140 01/08/2021   K 3.8 01/08/2021   CO2 23 01/08/2021   GLUCOSE 95 01/08/2021   BUN 9 01/08/2021   CREATININE 1.09 01/08/2021   BILITOT 1.4 (H) 01/08/2021   ALKPHOS 87 01/08/2021   AST 15 01/08/2021   ALT 14 01/08/2021   PROT 7.0 01/08/2021   ALBUMIN 4.5 01/08/2021   CALCIUM 9.4 01/08/2021   ANIONGAP 11 03/07/2020   EGFR 86 01/08/2021   Lab Results  Component Value Date   CHOL 168 01/08/2021   Lab Results  Component Value Date   HDL 29 (L) 01/08/2021   Lab Results  Component Value Date   LDLCALC 110 (H) 01/08/2021   Lab Results  Component Value Date   TRIG 162 (H) 01/08/2021   Lab Results  Component Value Date   CHOLHDL 5.8 (H) 01/08/2021   Lab Results  Component Value Date   HGBA1C 5.2 07/11/2019      Assessment & Plan:  1. Chronic pain of left knee Will get x-ray of the left knee.  Recommend he apply a compressive ACE bandage. Rest and elevate the affected painful area.  Apply cold compresses intermittently as needed.  A referral to orthopedics has also been made for further evaluation and treatment. - DG Knee 1-2 Views Left; Future - Ambulatory referral to Orthopedic Surgery  2. Chronic pain of right knee Will get x-ray of the right knee. Recommend he apply a compressive ACE bandage. Rest and elevate the affected painful area.  Apply cold compresses intermittently as needed.  A referral to orthopedics has also been made for further evaluation and treatment. - DG Knee 1-2 Views Right; Future  3. Body mass index  (BMI) of 30.0-30.9 in adult Encourage patient to limit calorie intake to 2000 cal/day or less.  He should consume a low cholesterol, low-fat diet.  Patient should incorporate exercise into his daily routine.   4. Hypertension, unspecified type Stable.  Continue losartan as prescribed.  Problem List Items Addressed This Visit       Cardiovascular and Mediastinum   Hypertension     Other   Chronic pain of right knee - Primary   Relevant Orders   DG Knee 1-2 Views Right   Body mass index (BMI) of 30.0-30.9 in adult      Follow-up: Return in about 9 months (around 04/17/2022) for health maintenance exam, blood pressure, FBW a week prior to visit.    Ronnell Freshwater, NP

## 2021-07-17 ENCOUNTER — Encounter: Payer: Self-pay | Admitting: Podiatry

## 2021-07-17 ENCOUNTER — Ambulatory Visit
Admission: RE | Admit: 2021-07-17 | Discharge: 2021-07-17 | Disposition: A | Discharge status: Home / Self Care | Payer: 59 | Source: Ambulatory Visit | Attending: Nurse Practitioner | Admitting: Nurse Practitioner

## 2021-07-17 ENCOUNTER — Other Ambulatory Visit: Payer: Self-pay

## 2021-07-17 DIAGNOSIS — M25561 Pain in right knee: Secondary | ICD-10-CM

## 2021-07-17 DIAGNOSIS — M25562 Pain in left knee: Secondary | ICD-10-CM

## 2021-07-17 NOTE — Progress Notes (Signed)
°  Subjective:  Patient ID: Larry Phillips, male    DOB: 21-Apr-1976,  MRN: [id number]  No chief complaint on file.   45 y.o. male presents with the above complaint.  Patient presents with follow-up to right first metatarsophalangeal joint pain.  Patient states is doing a little bit better.  The injection helped.  He would like to know if he can do another injection.  Overall doing much better.  It is improving with injection.   Review of Systems: Negative except as noted in the HPI. Denies N/V/F/Ch.  Past Medical History:  Diagnosis Date   Hypertension     Current Outpatient Medications:    amLODipine (NORVASC) 10 MG tablet, TAKE 1 TABLET(10 MG) BY MOUTH AT BEDTIME, Disp: 90 tablet, Rfl: 1   cephALEXin (KEFLEX) 500 MG capsule, Take 1 capsule (500 mg total) by mouth 2 (two) times daily. (Patient not taking: Reported on 07/16/2021), Disp: 10 capsule, Rfl: 0   losartan (COZAAR) 25 MG tablet, TAKE 1 TABLET(25 MG) BY MOUTH DAILY, Disp: 90 tablet, Rfl: 1   sildenafil (VIAGRA) 50 MG tablet, Take 1 tablet (50 mg total) by mouth daily as needed for erectile dysfunction., Disp: 10 tablet, Rfl: 2  Social History   Tobacco Use  Smoking Status Former   Types: Cigarettes   Quit date: 2012   Years since quitting: 10.9  Smokeless Tobacco Never    No Known Allergies Objective:  There were no vitals filed for this visit. There is no height or weight on file to calculate BMI. Constitutional Well developed. Well nourished.  Vascular Dorsalis pedis pulses palpable bilaterally. Posterior tibial pulses palpable bilaterally. Capillary refill normal to all digits.  No cyanosis or clubbing noted. Pedal hair growth normal.  Neurologic Normal speech. Oriented to person, place, and time. Epicritic sensation to light touch grossly present bilaterally.  Dermatologic Nails well groomed and normal in appearance. No open wounds. No skin lesions.  Orthopedic: Pain on palpation of right first  metatarsophalangeal joint.  Pain with range of motion of the first MPJ.  Deep intra-articular first MPJ pain noted.  No crepitus clinically appreciated.  No pain with range of motion of the second metatarsophalangeal joint.  No pain at the sesamoidal complex.   Radiographs: 3 views of skeletally mature the right foot: Uneven joint space narrowing noted.  Patient may also have a bone cyst formation under the subchondral bone.  He may have an osteochondral defect of the MPJ. Assessment:   1. Capsulitis of metatarsophalangeal (MTP) joint of right foot   2. Hallux limitus of right foot    Plan:  Patient was evaluated and treated and all questions answered.  Right first MPJ capsulitis with a possible underlying osteochondral defect/hallux limitus -I explained to the patient the etiology of capsulitis and various treatment options were extensively discussed.  Given the amount of pain that he is having I believe patient will benefit from steroid injection help decrease acute inflammatory component associate with pain.  Patient agrees with plan would like to proceed with steroid injection -A second steroid injection was performed at right first MPJ using 1% plain Lidocaine and 10 mg of Kenalog. This was well tolerated. -If there is no improvement we will discuss MRI evaluation. -He may benefit from orthotics in the future with offloading of the first MPJ to take the pressure off of it.  I will discuss this further during next clinical visit   No follow-ups on file.

## 2021-07-18 ENCOUNTER — Encounter: Payer: Self-pay | Admitting: Nurse Practitioner

## 2021-07-18 NOTE — Progress Notes (Signed)
Negative x-ray. Referral to ortho made during visit. MyChart message sent to patient.

## 2021-07-24 ENCOUNTER — Other Ambulatory Visit: Payer: Self-pay

## 2021-07-24 ENCOUNTER — Ambulatory Visit (INDEPENDENT_AMBULATORY_CARE_PROVIDER_SITE_OTHER): Payer: 59 | Admitting: Orthopaedic Surgery

## 2021-07-24 ENCOUNTER — Encounter: Payer: Self-pay | Admitting: Orthopaedic Surgery

## 2021-07-24 VITALS — Ht 72.0 in | Wt 269.0 lb

## 2021-07-24 DIAGNOSIS — M25561 Pain in right knee: Secondary | ICD-10-CM | POA: Diagnosis not present

## 2021-07-24 DIAGNOSIS — M25562 Pain in left knee: Secondary | ICD-10-CM | POA: Diagnosis not present

## 2021-07-24 MED ORDER — METHYLPREDNISOLONE ACETATE 40 MG/ML IJ SUSP
40.0000 mg | INTRAMUSCULAR | Status: AC | PRN
  Administered 2021-07-24: 10:00:00 40 mg via INTRA_ARTICULAR

## 2021-07-24 MED ORDER — LIDOCAINE HCL 1 % IJ SOLN
2.0000 mL | INTRAMUSCULAR | Status: AC | PRN
  Administered 2021-07-24: 10:00:00 2 mL

## 2021-07-24 MED ORDER — BUPIVACAINE HCL 0.25 % IJ SOLN
2.0000 mL | INTRAMUSCULAR | Status: AC | PRN
  Administered 2021-07-24: 10:00:00 2 mL via INTRA_ARTICULAR

## 2021-07-24 NOTE — Progress Notes (Signed)
Office Visit Note   Patient: Larry Phillips           Date of Birth: 1975-09-17           MRN: [id number] Visit Date: 07/24/2021              Requested by: Ronnell Freshwater, NP [place],  [place] [id number] PCP: Ronnell Freshwater, NP   Assessment & Plan: Visit Diagnoses:  1. Chronic pain of both knees     Plan: Impression is chronic intermittent bilateral knee pain left greater than right.  Currently the right is asymptomatic.  I believe the left knee is likely from early arthritic change and have discussed proceeding with cortisone injection.  He would like to proceed with this.  He will follow-up with [place] as needed.  Follow-Up Instructions: Return if symptoms worsen or fail to improve.   Orders:  Orders Placed This Encounter  Procedures   Large Joint Inj   No orders of the defined types were placed in this encounter.     Procedures: Large Joint Inj: L knee on 07/24/2021 9:34 AM Indications: pain Details: 22 G needle, anterolateral approach Medications: 2 mL lidocaine 1 %; 2 mL bupivacaine 0.25 %; 40 mg methylPREDNISolone acetate 40 MG/ML     Clinical Data: No additional findings.   Subjective: Chief Complaint  Patient presents with   Left Knee - Pain   Right Knee - Pain    HPI patient is a pleasant 45 year old gentleman who comes in today with bilateral knee pain left greater than right.  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  Currently, he is only having pain to the left knee.  Pain is to the medial aspect and is worse going from a seated to standing position as well as with stair climbing.  He denies any mechanical symptoms or instability.  He does note intermittent swelling.  He takes an occasional Tylenol and Advil with mild relief.  No previous cortisone injection.  Review of  Systems as detailed in HPI.  All others reviewed and are negative.   Objective: Vital Signs: Ht 6' (1.829 m)    Wt 269 lb (122 kg)    BMI 36.48 kg/m   Physical Exam well-developed well-nourished gentleman no acute distress but alert and oriented x3.  Ortho Exam left knee exam shows range of motion 0 to 120 degrees.  No joint line tenderness.  Mild patellofemoral crepitus.  Ligaments are stable.  He is neurovascular tact distally.  Right knee exam is unremarkable.  Specialty Comments:  No specialty comments available.  Imaging: X-rays reviewed by me in canopy of both knees show mild tricompartmental degenerative changes   PMFS History: Patient Active Problem List   Diagnosis Date Noted   Body mass index (BMI) of 30.0-30.9 in adult 07/16/2021   Encounter for general adult medical examination with abnormal findings 04/27/2021   Mixed hyperlipidemia 04/27/2021   Chronic pain of right knee 04/27/2021   Encounter to establish care 01/08/2021   Chest pain 01/08/2021   Salivary duct stone 01/08/2021   Generalized anxiety disorder 01/08/2021   Hypertension 03/11/2020   Atypical mole 07/14/2019   Healthcare maintenance 07/14/2019   Past Medical History:  Diagnosis Date   Hypertension     Family History  Problem  Relation Age of Onset   COPD Mother    Healthy Father     Past Surgical History:  Procedure Laterality Date   SUBMANDIBULAR GLAND EXCISION Right 05/12/2021   Procedure: EXCISION RIGHT SUBMANDIBULAR GLAND;  Surgeon: Rozetta Nunnery, MD;  Location: [facility];  Service: ENT;  Laterality: Right;   Social History   Occupational History   Not on file  Tobacco Use   Smoking status: Former    Types: Cigarettes    Quit date: 2012    Years since quitting: 10.9   Smokeless tobacco: Never  Vaping Use   Vaping Use: Never used  Substance and Sexual Activity   Alcohol use: Not Currently   Drug use: Not Currently    Types: Marijuana   Sexual activity:  Yes    Partners: Female

## 2021-07-31 ENCOUNTER — Other Ambulatory Visit: Payer: Self-pay | Admitting: Nurse Practitioner

## 2021-08-13 NOTE — Therapy (Deleted)
OUTPATIENT PHYSICAL THERAPY LOWER EXTREMITY EVALUATION   Patient Name: Larry Phillips MRN: [id number] DOB:1976-04-28, 46 y.o., male Today's Date: 08/13/2021    Past Medical History:  Diagnosis Date   Hypertension    Past Surgical History:  Procedure Laterality Date   SUBMANDIBULAR GLAND EXCISION Right 05/12/2021   Procedure: EXCISION RIGHT SUBMANDIBULAR GLAND;  Surgeon: Rozetta Nunnery, MD;  Location: [facility];  Service: ENT;  Laterality: Right;   Patient Active Problem List   Diagnosis Date Noted   Body mass index (BMI) of 30.0-30.9 in adult 07/16/2021   Encounter for general adult medical examination with abnormal findings 04/27/2021   Mixed hyperlipidemia 04/27/2021   Chronic pain of right knee 04/27/2021   Encounter to establish care 01/08/2021   Chest pain 01/08/2021   Salivary duct stone 01/08/2021   Generalized anxiety disorder 01/08/2021   Hypertension 03/11/2020   Atypical mole 07/14/2019   Healthcare maintenance 07/14/2019    PCP: Ronnell Freshwater, NP  REFERRING PROVIDER: Aundra Dubin, PA-C  REFERRING DIAG: Referral diagnosis: Chronic pain of both knees [M25.561, M25.562, G89.29]  THERAPY DIAG:  No diagnosis found.  ONSET DATE: ***  SUBJECTIVE:                                                                                                                                                                                           Larry Phillips is a 46 y.o. male who presents to clinic with chief complaint of ***.  MOI/History of condition:  ***  From referring provider:   "HPI patient is a pleasant 46 year old gentleman who comes in today with bilateral knee pain left greater than right.  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  Currently, he is only having pain to the left  knee.  Pain is to the medial aspect and is worse going from a seated to standing position as well as with stair climbing.  He denies any mechanical symptoms or instability.  He does note intermittent swelling.  He takes an occasional Tylenol and Advil with mild relief.  No previous cortisone injection."   Red flags:  {has/denies:26543} {kerredflag:26542}  Pertinent past history:  ***  Pain:  Are you having pain? {yes/no:20286} Pain location: *** NPRS scale:  highest {NUMBERS; 0-10:5044}/10 current {NUMBERS; 0-10:5044}/10  best {NUMBERS; 0-10:5044}/10 Aggravating factors: *** Relieving factors: *** Pain description: {PAIN DESCRIPTION:21022940} Severity: {Desc; low/moderate/high:110033} Irritability: {Desc; low/moderate/high:110033} Stage: {Desc; acute/subacute/chronic:13799} Stability: {kerbetterworse:26715} 24 hour  pattern: ***   Occupation: ***  Hobbies/Recreation: ***  Assistive Device: ***  Hand Dominance: ***  Patient Goals: ***   PRECAUTIONS: {Therapy precautions:24002}  WEIGHT BEARING RESTRICTIONS {Yes ***/No:24003}  FALLS:  Has patient fallen in last 6 months? {yes/no:20286}, Number of falls: ***  LIVING ENVIRONMENT: Lives with: {[facility] lives with:25569::"lives with their family"} Stairs: {yes/no:20286}; {Stairs:24000}  PLOF: {PLOF:24004}  DIAGNOSTIC FINDINGS:   Imaging: X-rays reviewed by me in canopy of both knees show mild tricompartmental degenerative changes   OBJECTIVE:    GENERAL OBSERVATION:   ***  SENSATION:  Light touch: {intact/deficits:24005}  PALPATION: ***  MUSCLE LENGTH: Hamstrings: Right *** deg; Left *** deg [name] test: Right (***); Left (***) Ely's test: Right (***); Left (***) Ober's test: Right (***); Left (***)  LE MMT:  MMT Right 08/13/2021 Left 08/13/2021  Hip flexion (L2, L3)    Knee extension (L3)    Knee flexion    Hip abduction    Hip extension    Hip external rotation    Hip internal rotation    Hip  adduction    Ankle dorsiflexion (L4)    Ankle plantarflexion (S1)    Ankle inversion    Ankle eversion    Great Toe ext (L5)     (Blank rows = not tested, score listed is out of 5 possible points.  N = WNL, D = diminished, C = clear for gross weakness with myotome testing, * = concordant pain with testing)  LE ROM:  ROM Right 08/13/2021 Left 08/13/2021  Hip flexion    Hip extension    Hip abduction    Hip adduction    Hip internal rotation    Hip external rotation    Knee flexion    Knee extension    Ankle dorsiflexion    Ankle plantarflexion    Ankle inversion    Ankle eversion      (Blank rows = not tested)  LOWER EXTREMITY SPECIAL TESTS:  Knee special tests: {KNEE SPECIAL TESTS:26240}  FUNCTIONAL TESTS:  ***  GAIT: Comments: ***  PATIENT SURVEYS:  {rehab surveys:24030}    TODAY'S TREATMENT: ***   PATIENT EDUCATION:  POC, diagnosis, prognosis, HEP, and outcome measures.  Pt educated via explanation, demonstration, and handout (HEP).  Pt confirms understanding verbally.    HOME EXERCISE PROGRAM: ***  ASSESSMENT:  CLINICAL IMPRESSION: Larry Phillips is a 46 y.o. male who presents to clinic with signs and sxs consistent with ***.  Patient presents with pain and impairments/deficits in: ***.  Activity limitations include: ***.  Participation limitations include: ***.  Patient will benefit from skilled therapy to address pain and the listed deficits in order to achieve functional goals, enable safety and independence in completion of daily tasks, and return to PLOF.   REHAB POTENTIAL: {rehabpotential:25112}  CLINICAL DECISION MAKING: {clinical decision making:25114}  EVALUATION COMPLEXITY: {Evaluation complexity:25115}   GOALS:  SHORT TERM GOALS:  STG Name Target Date Goal status  1 Larry Phillips will be >75% HEP compliant to improve carryover between sessions and facilitate independent management of condition  Baseline: No HEP 09/03/2021 INITIAL   LONG TERM  GOALS:   LTG Name Target Date Goal status  1 *** 10/08/2021 INITIAL  2 *** 10/08/2021 INITIAL  3 *** 10/08/2021 INITIAL  4 *** 10/08/2021 INITIAL  5 *** 10/08/2021 INITIAL  6 *** 10/08/2021 INITIAL  7 *** 10/08/2021 INITIAL   PLAN: PT FREQUENCY: 1-2x/week  PT DURATION: 8 weeks (Ending 10/08/2021)  PLANNED INTERVENTIONS: Therapeutic exercises, Therapeutic activity, Neuro Muscular re-education,  Gait training, Patient/Family education, Joint mobilization, Dry Needling, Electrical stimulation, Spinal mobilization and/or manipulation, Moist heat, Taping, Vasopneumatic device, Ionotophoresis 4mg /ml Dexamethasone, and Manual therapy  PLAN FOR NEXT SESSION: ***   Shearon Balo PT, DPT 08/13/2021, 12:13 PM

## 2021-08-14 ENCOUNTER — Ambulatory Visit: Payer: Self-pay | Attending: Physician Assistant | Admitting: Physical Therapy

## 2021-08-15 IMAGING — CR DG CHEST 2V
2 series · 2 of 2 positions shown · non-contrast
Comparison: PA and lateral chest 03/15/2011.

CLINICAL DATA: Chest pain today.

EXAM:
CHEST - 2 VIEW

[chest pa]
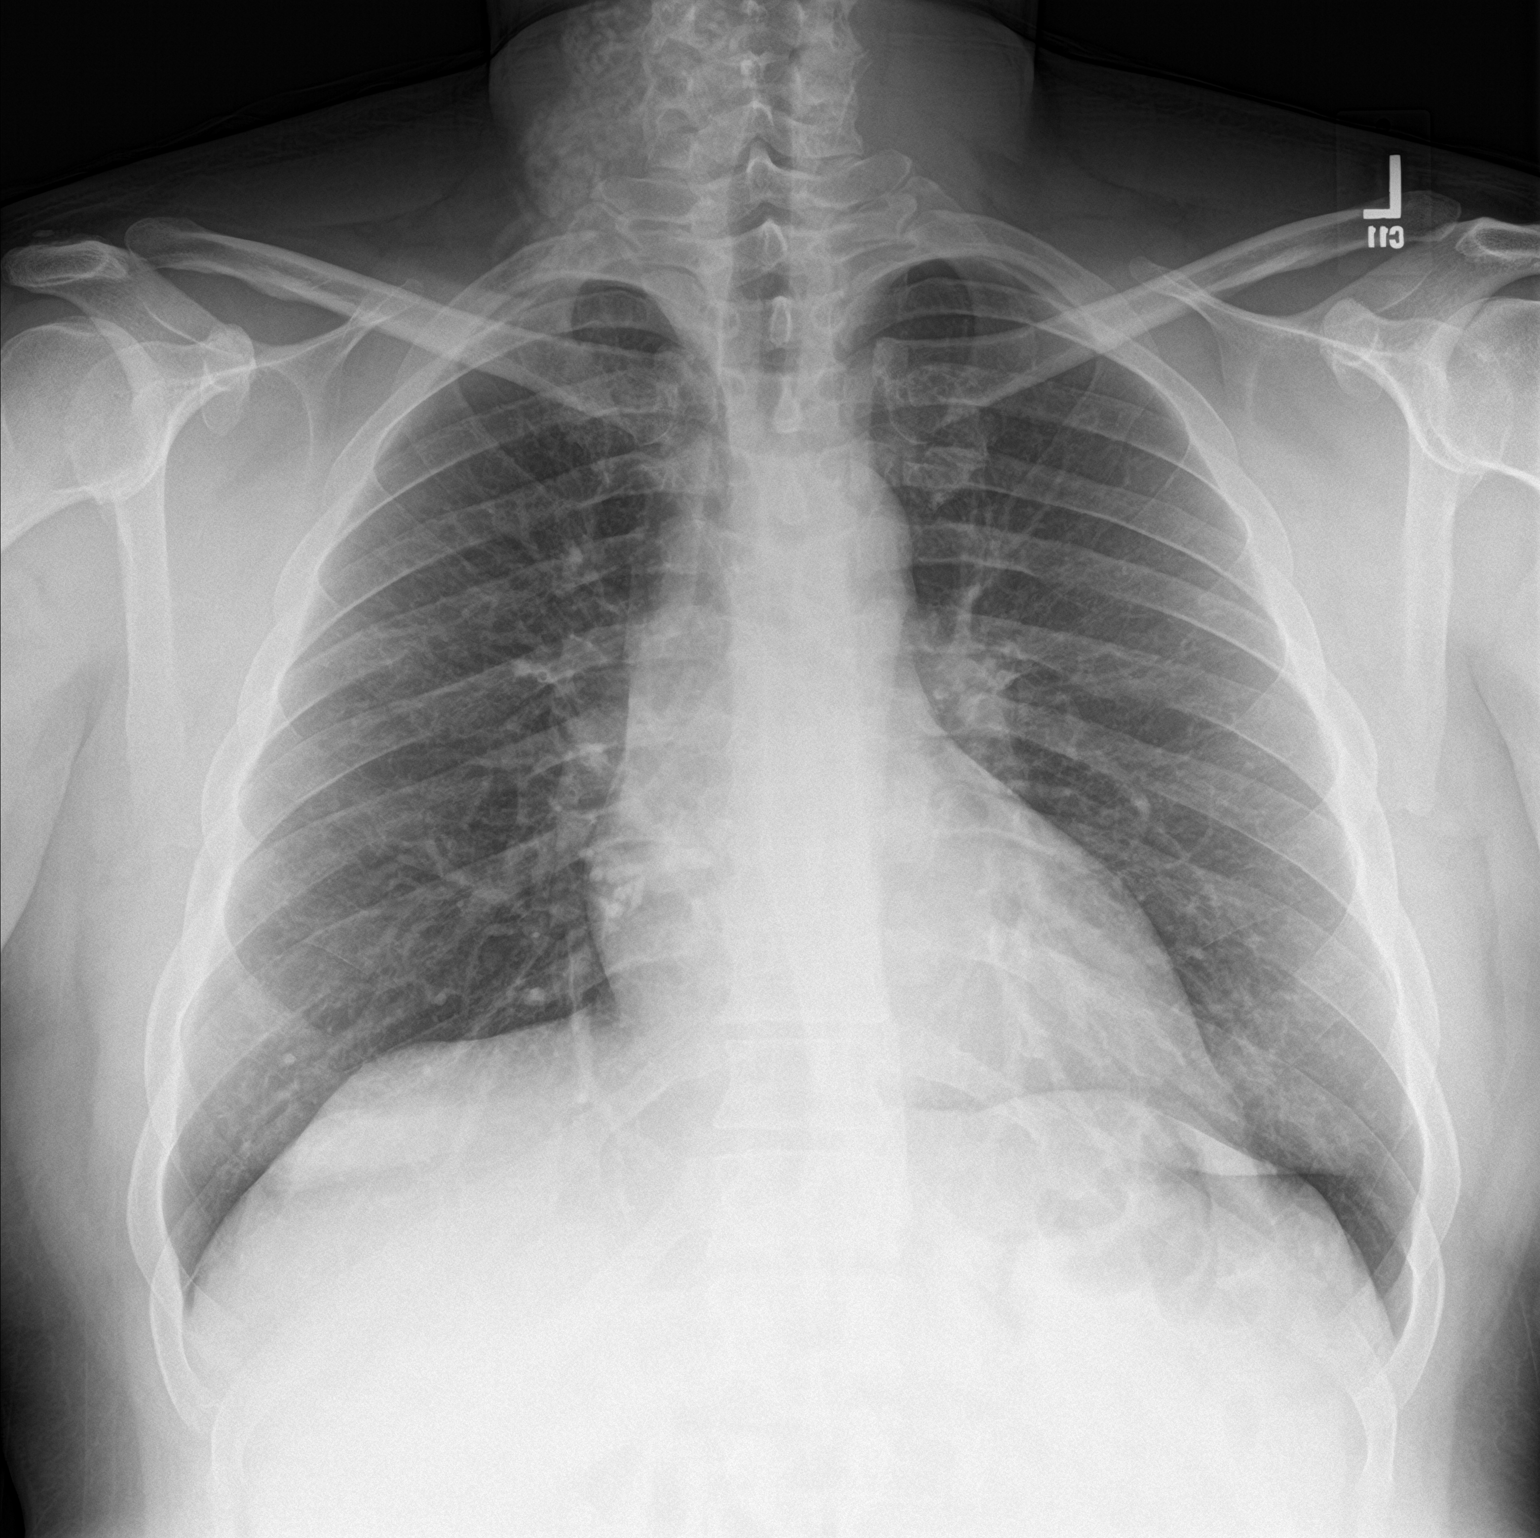

[chest lat]
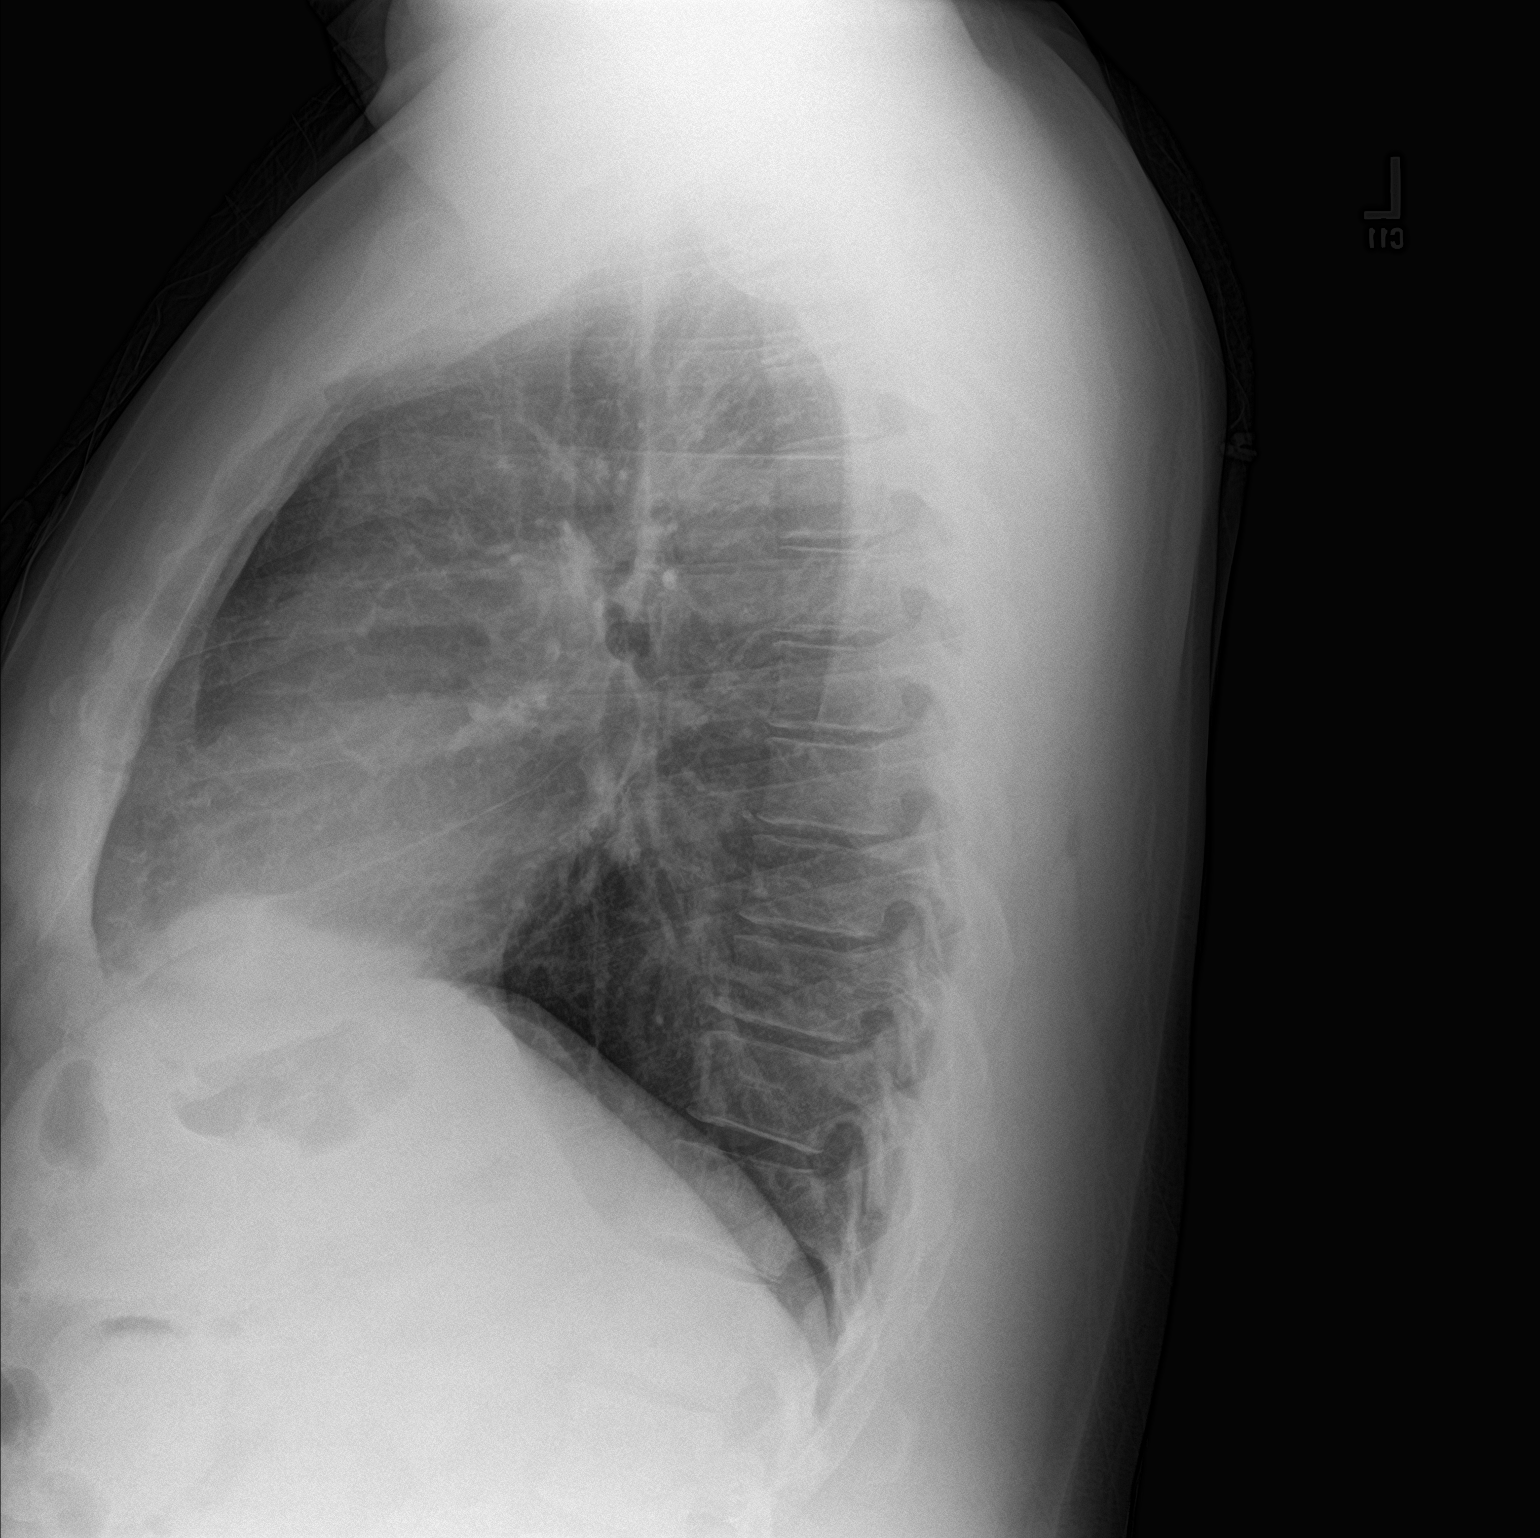

[2 of 2 positions shown; findings below may reference images not displayed]

FINDINGS: Lungs clear. Heart size normal. No pneumothorax or pleural fluid. No
acute or focal bony abnormality.
IMPRESSION: Negative chest.

## 2021-08-26 ENCOUNTER — Ambulatory Visit: Payer: Self-pay | Admitting: Podiatry

## 2021-09-02 ENCOUNTER — Other Ambulatory Visit: Payer: Self-pay

## 2021-09-02 ENCOUNTER — Ambulatory Visit (INDEPENDENT_AMBULATORY_CARE_PROVIDER_SITE_OTHER): Payer: 59 | Admitting: Podiatry

## 2021-09-02 DIAGNOSIS — M7751 Other enthesopathy of right foot: Secondary | ICD-10-CM | POA: Diagnosis not present

## 2021-09-02 DIAGNOSIS — M205X1 Other deformities of toe(s) (acquired), right foot: Secondary | ICD-10-CM | POA: Diagnosis not present

## 2021-09-02 NOTE — Progress Notes (Signed)
°  Subjective:  Patient ID: Larry Phillips, male    DOB: 10-30-1975,  MRN: [id number]  Chief Complaint  Patient presents with   Foot Pain    Right foot pt stated that he is still having some pain and discomfort      46 y.o. male presents with the above complaint.  Patient presents with follow-up to right first metatarsophalangeal joint pain.  He states is doing much better.  The 2 injection has helped.  He is ambulating regular shoes.  He has quit his current job as well.  Review of Systems: Negative except as noted in the HPI. Denies N/V/F/Ch.  Past Medical History:  Diagnosis Date   Hypertension     Current Outpatient Medications:    amLODipine (NORVASC) 10 MG tablet, TAKE 1 TABLET(10 MG) BY MOUTH AT BEDTIME, Disp: 90 tablet, Rfl: 1   losartan (COZAAR) 25 MG tablet, TAKE 1 TABLET(25 MG) BY MOUTH DAILY, Disp: 90 tablet, Rfl: 1   sildenafil (VIAGRA) 50 MG tablet, Take 1 tablet (50 mg total) by mouth daily as needed for erectile dysfunction., Disp: 10 tablet, Rfl: 2  Social History   Tobacco Use  Smoking Status Former   Types: Cigarettes   Quit date: 2012   Years since quitting: 11.0  Smokeless Tobacco Never    No Known Allergies Objective:  There were no vitals filed for this visit. There is no height or weight on file to calculate BMI. Constitutional Well developed. Well nourished.  Vascular Dorsalis pedis pulses palpable bilaterally. Posterior tibial pulses palpable bilaterally. Capillary refill normal to all digits.  No cyanosis or clubbing noted. Pedal hair growth normal.  Neurologic Normal speech. Oriented to person, place, and time. Epicritic sensation to light touch grossly present bilaterally.  Dermatologic Nails well groomed and normal in appearance. No open wounds. No skin lesions.  Orthopedic: Very mild pain on palpation of right first metatarsophalangeal joint.  Mild pain with range of motion of the first MPJ.  No further deep intra-articular first MPJ  pain noted.  No crepitus clinically appreciated.  No pain with range of motion of the second metatarsophalangeal joint.  No pain at the sesamoidal complex.   Radiographs: 3 views of skeletally mature the right foot: Uneven joint space narrowing noted.  Patient may also have a bone cyst formation under the subchondral bone.  He may have an osteochondral defect of the MPJ. Assessment:   1. Capsulitis of metatarsophalangeal (MTP) joint of right foot   2. Hallux limitus of right foot     Plan:  Patient was evaluated and treated and all questions answered.  Right first MPJ capsulitis with a possible underlying osteochondral defect/hallux limitus -Clinically patient has healed with steroid injection.  At this time I discussed with him if it flares up again to come back and see me for maintenance steroid injection as needed.  I discussed with him as long as a steroid injection helping keep his pain down we will continue to do so.  He states understanding. -If there is no improvement we will discuss MRI evaluation. -He may benefit from orthotics in the future with offloading of the first MPJ to take the pressure off of it.  I will discuss this further during next clinical visit   No follow-ups on file.

## 2021-09-23 ENCOUNTER — Ambulatory Visit: Payer: Self-pay | Admitting: Orthopaedic Surgery

## 2021-09-30 ENCOUNTER — Ambulatory Visit: Payer: Self-pay | Admitting: Orthopaedic Surgery

## 2021-10-13 ENCOUNTER — Encounter: Payer: Self-pay | Admitting: Podiatry

## 2021-11-07 ENCOUNTER — Encounter: Payer: Self-pay | Admitting: Nurse Practitioner

## 2021-11-08 ENCOUNTER — Other Ambulatory Visit: Payer: Self-pay | Admitting: Nurse Practitioner

## 2021-11-08 DIAGNOSIS — N529 Male erectile dysfunction, unspecified: Secondary | ICD-10-CM

## 2021-11-10 ENCOUNTER — Other Ambulatory Visit: Payer: Self-pay | Admitting: Nurse Practitioner

## 2021-11-10 DIAGNOSIS — N529 Male erectile dysfunction, unspecified: Secondary | ICD-10-CM

## 2021-11-10 MED ORDER — SILDENAFIL CITRATE 50 MG PO TABS: 50.0000 mg | ORAL_TABLET | Freq: Every day | ORAL | 3 refills | Status: DC | PRN

## 2021-11-10 NOTE — Progress Notes (Signed)
Refilled viagra and sent new prescription to [facility] on [address] in [place].  ?

## 2022-02-05 ENCOUNTER — Encounter: Payer: Self-pay | Admitting: Nurse Practitioner

## 2022-02-11 ENCOUNTER — Encounter: Payer: Self-pay | Admitting: Nurse Practitioner

## 2022-02-12 ENCOUNTER — Other Ambulatory Visit: Payer: Self-pay

## 2022-02-13 ENCOUNTER — Encounter: Payer: Self-pay | Admitting: Nurse Practitioner

## 2022-02-13 DIAGNOSIS — I10 Essential (primary) hypertension: Secondary | ICD-10-CM

## 2022-02-13 MED ORDER — AMLODIPINE BESYLATE 10 MG PO TABS: 10.0000 mg | ORAL_TABLET | Freq: Every day | ORAL | 1 refills | Status: DC

## 2022-02-13 MED ORDER — LOSARTAN POTASSIUM 25 MG PO TABS: ORAL_TABLET | ORAL | 1 refills | Status: DC

## 2022-04-16 NOTE — Progress Notes (Signed)
Complete physical exam   Patient: Larry Phillips   DOB: January 11, 1976   46 y.o. Male  MRN: [id number] Visit Date: 04/17/2022    Chief Complaint  Patient presents with   Annual Exam   Subjective    Larry Phillips is a 46 y.o. male who presents today for a complete physical exam.  He reports consuming a  generally healthy  diet. Has cut out excess sugar. Increased water intake.  Patient states that he doesn't currently work out, but is about to start. Purchased a rowing machine and is going to get a weight machine. He generally feels well. He does have additional problems to discuss today.   HPI  Annual physical  -did have some left sided abdominal tenderness which has resolved. He states that he stopped drinking beverages high in sugar and increased water intake.  -due to have routine, fasting labs  -history of hypertension which is typically well managed.  Past Medical History:  Diagnosis Date   Hypertension    Past Surgical History:  Procedure Laterality Date   SUBMANDIBULAR GLAND EXCISION Right 05/12/2021   Procedure: EXCISION RIGHT SUBMANDIBULAR GLAND;  Surgeon: Rozetta Nunnery, MD;  Location: [facility];  Service: ENT;  Laterality: Right;   Social History   Socioeconomic History   Marital status: Married    Spouse name: Not on file   Number of children: Not on file   Years of education: Not on file   Highest education level: Not on file  Occupational History   Not on file  Tobacco Use   Smoking status: Former    Types: Cigarettes    Quit date: 2012    Years since quitting: 11.7   Smokeless tobacco: Never  Vaping Use   Vaping Use: Never used  Substance and Sexual Activity   Alcohol use: Not Currently   Drug use: Not Currently    Types: Marijuana   Sexual activity: Yes    Partners: Female  Other Topics Concern   Not on file  Social History Narrative   Not on file   Social Determinants of Health   Financial Resource Strain: Not on  file  Food Insecurity: Not on file  Transportation Needs: Not on file  Physical Activity: Not on file  Stress: Not on file  Social Connections: Not on file  Intimate Partner Violence: Not on file   Family Status  Relation Name Status   Mother  Alive   Father  Alive   Family History  Problem Relation Age of Onset   COPD Mother    Healthy Father    No Known Allergies  Patient Care Team: Ronnell Freshwater, NP as PCP - General (Family Medicine)   Medications: Outpatient Medications Prior to Visit  Medication Sig   amLODipine (NORVASC) 10 MG tablet Take 1 tablet (10 mg total) by mouth daily.   losartan (COZAAR) 25 MG tablet TAKE 1 TABLET(25 MG) BY MOUTH DAILY   [DISCONTINUED] sildenafil (VIAGRA) 50 MG tablet Take 1 tablet (50 mg total) by mouth daily as needed for erectile dysfunction.   No facility-administered medications prior to visit.    Review of Systems  Constitutional:  Negative for activity change, chills, fatigue and fever.  HENT:  Negative for congestion, postnasal drip, rhinorrhea, sinus pressure, sinus pain, sneezing and sore throat.   Eyes: Negative.   Respiratory:  Negative for cough, shortness of breath and wheezing.   Cardiovascular:  Negative for chest pain and palpitations.  Gastrointestinal:  Negative  for constipation, diarrhea, nausea and vomiting.  Endocrine: Negative for cold intolerance, heat intolerance, polydipsia and polyuria.  Genitourinary:  Negative for dysuria, frequency and urgency.  Musculoskeletal:  Negative for back pain and myalgias.  Skin:  Negative for rash.  Allergic/Immunologic: Negative for environmental allergies.  Neurological:  Negative for dizziness, weakness and headaches.  Psychiatric/Behavioral:  The patient is not nervous/anxious.        Objective     Today's Vitals   04/17/22 0923  BP: 122/79  Pulse: 83  SpO2: 97%  Weight: 259 lb 6.4 oz (117.7 kg)  Height: 6' (1.829 m)   Body mass index is 35.18 kg/m.   BP  Readings from Last 3 Encounters:  04/17/22 122/79  07/16/21 116/75  05/12/21 131/88    Wt Readings from Last 3 Encounters:  04/17/22 259 lb 6.4 oz (117.7 kg)  07/24/21 269 lb (122 kg)  07/16/21 269 lb 6.4 oz (122.2 kg)     Physical Exam Vitals and nursing note reviewed.  Constitutional:      Appearance: Normal appearance. He is well-developed.  HENT:     Head: Normocephalic.  Eyes:     Pupils: Pupils are equal, round, and reactive to light.  Cardiovascular:     Rate and Rhythm: Normal rate and regular rhythm.     Pulses: Normal pulses.     Heart sounds: Normal heart sounds.  Pulmonary:     Effort: Pulmonary effort is normal.     Breath sounds: Normal breath sounds.  Abdominal:     Palpations: Abdomen is soft.  Musculoskeletal:        General: Normal range of motion.     Cervical back: Normal range of motion and neck supple.  Lymphadenopathy:     Cervical: No cervical adenopathy.  Skin:    General: Skin is warm and dry.     Capillary Refill: Capillary refill takes less than 2 seconds.  Neurological:     General: No focal deficit present.     Mental Status: He is alert and oriented to person, place, and time.  Psychiatric:        Mood and Affect: Mood normal.        Behavior: Behavior normal.        Thought Content: Thought content normal.        Judgment: Judgment normal.     Last depression screening scores   Row Labels 04/17/2022    9:26 AM 07/16/2021   11:14 AM 04/16/2021    1:33 PM  PHQ 2/9 Scores   Section Header. No data exists in this row.     PHQ - 2 Score   '1 2 2  '$ PHQ- 9 Score   '4 5 7   '$ Last fall risk screening   Row Labels 07/16/2021   11:14 AM  Fall Risk    Section Header. No data exists in this row.   Falls in the past year?   0  Number falls in past yr:   0  Injury with Fall?   0  Follow up   Falls evaluation completed    Assessment & Plan    1. Encounter for general adult medical examination with abnormal findings Annual physical  today.  2. Essential hypertension Stable.  Continue blood pressure medications as prescribed. - Lipid panel - Comprehensive metabolic panel - CBC - Hemoglobin A1c  3. Mixed hyperlipidemia Check fasting lipids today. - TSH + free T4 - Lipid panel - Comprehensive metabolic panel - CBC -  Hemoglobin A1c  4. Erectile dysfunction, unspecified erectile dysfunction type May take sildenafil 50 mg tablets as needed.  New prescription sent today. - sildenafil (VIAGRA) 50 MG tablet; Take 1 tablet (50 mg total) by mouth daily as needed for erectile dysfunction.  Dispense: 30 tablet; Refill: 5  5. Healthcare maintenance Routine, fasting labs drawn during today's visit.  - TSH + free T4 - Lipid panel - Hemoglobin A1c    Health Maintenance  Topic Date Due   TETANUS/TDAP  Never done   INFLUENZA VACCINE  Never done   COVID-19 Vaccine (1) 05/03/2022 (Originally 02/17/1976)   Hepatitis C Screening  Completed   HIV Screening  Completed   HPV VACCINES  Aged Out    Discussed health benefits of physical activity, and encouraged him to engage in regular exercise appropriate for his age and condition.  Problem List Items Addressed This Visit       Cardiovascular and Mediastinum   Essential hypertension   Relevant Medications   sildenafil (VIAGRA) 50 MG tablet   Other Relevant Orders   Lipid panel (Completed)   Comprehensive metabolic panel (Completed)   CBC (Completed)   Hemoglobin A1c (Completed)     Other   Healthcare maintenance   Relevant Orders   TSH + free T4 (Completed)   Lipid panel (Completed)   Hemoglobin A1c (Completed)   Encounter for general adult medical examination with abnormal findings - Primary   Mixed hyperlipidemia   Relevant Medications   sildenafil (VIAGRA) 50 MG tablet   Other Relevant Orders   TSH + free T4 (Completed)   Lipid panel (Completed)   Comprehensive metabolic panel (Completed)   CBC (Completed)   Hemoglobin A1c (Completed)   Erectile  dysfunction   Relevant Medications   sildenafil (VIAGRA) 50 MG tablet     Return in about 6 months (around 10/16/2022) for blood pressure.        Ronnell Freshwater, NP  [facility] at [facility] [phone] (phone) [phone] (fax)  [place]

## 2022-04-17 ENCOUNTER — Ambulatory Visit (INDEPENDENT_AMBULATORY_CARE_PROVIDER_SITE_OTHER): Payer: Commercial Managed Care - HMO | Admitting: Nurse Practitioner

## 2022-04-17 ENCOUNTER — Encounter: Payer: Self-pay | Admitting: Nurse Practitioner

## 2022-04-17 VITALS — BP 122/79 | HR 83 | Ht 72.0 in | Wt 259.4 lb

## 2022-04-17 DIAGNOSIS — N529 Male erectile dysfunction, unspecified: Secondary | ICD-10-CM

## 2022-04-17 DIAGNOSIS — I10 Essential (primary) hypertension: Secondary | ICD-10-CM | POA: Diagnosis not present

## 2022-04-17 DIAGNOSIS — E782 Mixed hyperlipidemia: Secondary | ICD-10-CM | POA: Diagnosis not present

## 2022-04-17 DIAGNOSIS — Z0001 Encounter for general adult medical examination with abnormal findings: Secondary | ICD-10-CM

## 2022-04-17 DIAGNOSIS — Z0000 Encounter for general adult medical examination without abnormal findings: Secondary | ICD-10-CM

## 2022-04-17 MED ORDER — SILDENAFIL CITRATE 50 MG PO TABS: 50.0000 mg | ORAL_TABLET | Freq: Every day | ORAL | 5 refills | Status: DC | PRN

## 2022-04-18 LAB — LIPID PANEL
Chol/HDL Ratio: 6.3 ratio — ABNORMAL HIGH (ref 0.0–5.0)
Cholesterol, Total: 175 mg/dL (ref 100–199)
HDL: 28 mg/dL — ABNORMAL LOW (ref >39)
LDL Chol Calc (NIH): 113 mg/dL — ABNORMAL HIGH (ref 0–99)
Triglycerides: 193 mg/dL — ABNORMAL HIGH (ref 0–149)
VLDL Cholesterol Cal: 34 mg/dL (ref 5–40)

## 2022-04-18 LAB — COMPREHENSIVE METABOLIC PANEL
ALT: 11 IU/L (ref 0–44)
AST: 11 IU/L (ref 0–40)
Albumin/Globulin Ratio: 1.7 (ref 1.2–2.2)
Albumin: 4.3 g/dL (ref 4.1–5.1)
Alkaline Phosphatase: 89 IU/L (ref 44–121)
BUN/Creatinine Ratio: 7 — ABNORMAL LOW (ref 9–20)
BUN: 7 mg/dL (ref 6–24)
Bilirubin Total: 0.9 mg/dL (ref 0.0–1.2)
CO2: 25 mmol/L (ref 20–29)
Calcium: 9.5 mg/dL (ref 8.7–10.2)
Chloride: 102 mmol/L (ref 96–106)
Creatinine, Ser: 1.01 mg/dL (ref 0.76–1.27)
Globulin, Total: 2.5 g/dL (ref 1.5–4.5)
Glucose: 100 mg/dL — ABNORMAL HIGH (ref 70–99)
Potassium: 4 mmol/L (ref 3.5–5.2)
Sodium: 141 mmol/L (ref 134–144)
Total Protein: 6.8 g/dL (ref 6.0–8.5)
eGFR: 94 mL/min/{1.73_m2} (ref >59)

## 2022-04-18 LAB — CBC
Hematocrit: 46.2 % (ref 37.5–51.0)
Hemoglobin: 15.7 g/dL (ref 13.0–17.7)
MCH: 32 pg (ref 26.6–33.0)
MCHC: 34 g/dL (ref 31.5–35.7)
MCV: 94 fL (ref 79–97)
Platelets: 297 10*3/uL (ref 150–450)
RBC: 4.9 x10E6/uL (ref 4.14–5.80)
RDW: 13.5 % (ref 11.6–15.4)
WBC: 5.3 10*3/uL (ref 3.4–10.8)

## 2022-04-18 LAB — TSH+FREE T4
Free T4: 1.22 ng/dL (ref 0.82–1.77)
TSH: 2.26 u[IU]/mL (ref 0.450–4.500)

## 2022-04-18 LAB — HEMOGLOBIN A1C
Est. average glucose Bld gHb Est-mCnc: 111 mg/dL
Hgb A1c MFr Bld: 5.5 % (ref 4.8–5.6)

## 2022-04-20 ENCOUNTER — Encounter: Payer: Self-pay | Admitting: Nurse Practitioner

## 2022-04-20 NOTE — Progress Notes (Signed)
Mychart message sent to patient regarding lab results.

## 2022-05-03 DIAGNOSIS — N529 Male erectile dysfunction, unspecified: Secondary | ICD-10-CM | POA: Insufficient documentation

## 2022-05-12 ENCOUNTER — Encounter: Payer: Self-pay | Admitting: Nurse Practitioner

## 2022-08-07 ENCOUNTER — Encounter: Payer: Self-pay | Admitting: Nurse Practitioner

## 2022-08-07 ENCOUNTER — Other Ambulatory Visit: Payer: Self-pay | Admitting: Nurse Practitioner

## 2022-08-07 DIAGNOSIS — I10 Essential (primary) hypertension: Secondary | ICD-10-CM

## 2022-10-16 ENCOUNTER — Ambulatory Visit (INDEPENDENT_AMBULATORY_CARE_PROVIDER_SITE_OTHER): Payer: Commercial Managed Care - HMO | Admitting: Nurse Practitioner

## 2022-10-16 ENCOUNTER — Encounter: Payer: Self-pay | Admitting: Nurse Practitioner

## 2022-10-16 VITALS — BP 122/73 | HR 78 | Ht 72.0 in | Wt 266.0 lb

## 2022-10-16 DIAGNOSIS — R079 Chest pain, unspecified: Secondary | ICD-10-CM | POA: Diagnosis not present

## 2022-10-16 DIAGNOSIS — Z1211 Encounter for screening for malignant neoplasm of colon: Secondary | ICD-10-CM

## 2022-10-16 DIAGNOSIS — E782 Mixed hyperlipidemia: Secondary | ICD-10-CM | POA: Diagnosis not present

## 2022-10-16 DIAGNOSIS — I10 Essential (primary) hypertension: Secondary | ICD-10-CM

## 2022-10-16 DIAGNOSIS — N529 Male erectile dysfunction, unspecified: Secondary | ICD-10-CM

## 2022-10-16 DIAGNOSIS — R9431 Abnormal electrocardiogram [ECG] [EKG]: Secondary | ICD-10-CM | POA: Diagnosis not present

## 2022-10-16 MED ORDER — SILDENAFIL CITRATE 50 MG PO TABS: 50.0000 mg | ORAL_TABLET | Freq: Every day | ORAL | 5 refills | Status: DC | PRN

## 2022-10-16 NOTE — Progress Notes (Signed)
Established patient visit   Patient: Larry Phillips   DOB: [birth year]   47 y.o. Male  MRN: [id number] Visit Date: 10/16/2022   Chief Complaint  Patient presents with   Medical Management of Chronic Issues   Subjective    Chest Pain  This is a new problem. The current episode started in the past 7 days. The onset quality is sudden. The problem occurs intermittently. The problem has been gradually improving. The pain is present in the epigastric region and lateral region. The quality of the pain is described as pressure and tightness. The pain radiates to the epigastrium. Associated symptoms include abdominal pain and back pain. The pain is aggravated by exertion. He has tried nothing for the symptoms. Risk factors include male gender and stress.  His past medical history is significant for hypertension. Past medical history comments: history of irregular T waves    Patient states that he was very active last week prior to having episodes of chest pain.    Medications: Outpatient Medications Prior to Visit  Medication Sig   amLODipine (NORVASC) 10 MG tablet TAKE 1 TABLET(10 MG) BY MOUTH DAILY   losartan (COZAAR) 25 MG tablet TAKE 1 TABLET(25 MG) BY MOUTH DAILY   [DISCONTINUED] sildenafil (VIAGRA) 50 MG tablet Take 1 tablet (50 mg total) by mouth daily as needed for erectile dysfunction.   No facility-administered medications prior to visit.    Review of Systems  Cardiovascular:  Positive for chest pain.  Gastrointestinal:  Positive for abdominal pain.  Musculoskeletal:  Positive for back pain.       Objective     Today's Vitals   10/16/22 0834  BP: 122/73  Pulse: 78  SpO2: 97%  Weight: 266 lb (120.7 kg)  Height: 6' (1.829 m)   Body mass index is 36.08 kg/m.  BP Readings from Last 3 Encounters:  10/16/22 122/73  04/17/22 122/79  07/16/21 116/75    Wt Readings from Last 3 Encounters:  10/16/22 266 lb (120.7 kg)  04/17/22 259 lb 6.4 oz (117.7 kg)  07/24/21 269  lb (122 kg)    Physical Exam Vitals and nursing note reviewed.  Constitutional:      Appearance: Normal appearance. He is well-developed.  HENT:     Head: Normocephalic and atraumatic.     Nose: Nose normal.     Mouth/Throat:     Mouth: Mucous membranes are moist.     Pharynx: Oropharynx is clear.  Eyes:     Extraocular Movements: Extraocular movements intact.     Conjunctiva/sclera: Conjunctivae normal.     Pupils: Pupils are equal, round, and reactive to light.  Cardiovascular:     Rate and Rhythm: Normal rate and regular rhythm.     Pulses: Normal pulses.     Heart sounds: Normal heart sounds.     Comments: EKG showing normal sinus rhythm with nonspecific T wave abnormality.  Pulmonary:     Effort: Pulmonary effort is normal.     Breath sounds: Normal breath sounds.  Chest:     Chest wall: No mass or tenderness.     Comments: Unable to reproduce pain with palpation.  Abdominal:     Palpations: Abdomen is soft.  Musculoskeletal:        General: Normal range of motion.     Cervical back: Normal range of motion and neck supple.  Lymphadenopathy:     Cervical: No cervical adenopathy.  Skin:    General: Skin is warm and dry.  Capillary Refill: Capillary refill takes less than 2 seconds.  Neurological:     General: No focal deficit present.     Mental Status: He is alert and oriented to person, place, and time.  Psychiatric:        Mood and Affect: Mood normal.        Behavior: Behavior normal.        Thought Content: Thought content normal.        Judgment: Judgment normal.      Assessment & Plan    1. Chest pain, unspecified type Chest pain of unclear etiology. Refer to cardiology for further evaluation.  - Ambulatory referral to Cardiology  2. Abnormal ECG Nonspecific t wave abnormality on EKG. Unchanged from prior EKGs. Due to unspecified chest pain, refer to cardiology for further evaluation and treatment.  - Ambulatory referral to Cardiology  3.  Essential hypertension Blood pressure stable. Continue bp medication as prescribed.   4. Mixed hyperlipidemia Recommend he limit intake of fried and fatty foods. He should increase intake of lean proteins and green leafy vegetables. Adding exercise into daily routine will also be beneficial.    5. Erectile dysfunction, unspecified erectile dysfunction type May continue to take viagra as needed. New prescription sent to his pharmacy.  - sildenafil (VIAGRA) 50 MG tablet; Take 1 tablet (50 mg total) by mouth daily as needed for erectile dysfunction.  Dispense: 30 tablet; Refill: 5  6. Screening for colon cancer Order placed for Cologuard screening for colon cancer  - Cologuard    Problem List Items Addressed This Visit       Cardiovascular and Mediastinum   Essential hypertension   Relevant Medications   sildenafil (VIAGRA) 50 MG tablet     Other   Chest pain - Primary   Relevant Orders   Ambulatory referral to Cardiology   Mixed hyperlipidemia   Relevant Medications   sildenafil (VIAGRA) 50 MG tablet   Erectile dysfunction   Relevant Medications   sildenafil (VIAGRA) 50 MG tablet   Abnormal ECG   Relevant Orders   Ambulatory referral to Cardiology   Other Visit Diagnoses     Screening for colon cancer       Relevant Orders   Cologuard        Return in about 6 months (around 04/18/2023) for health maintenance exam, FBW a week prior to visit.         Ronnell Freshwater, NP  [facility] at [facility] [phone] (phone) [phone] (fax)  [place]

## 2022-10-16 NOTE — Addendum Note (Signed)
Addended by: Vivia Birmingham on: 10/16/2022 09:45 AM   Modules accepted: Orders

## 2022-10-20 NOTE — Progress Notes (Unsigned)
Cardiology Office Note:    Date:  10/22/2022   ID:  Larry Phillips, DOB 05/18/1976, MRN [id number]  PCP:  Ronnell Freshwater, NP   [facility] Providers Cardiologist:  Lenna Sciara, MD Referring MD: Ronnell Freshwater, NP   Chief Complaint/Reason for Referral: Chest pain  ASSESSMENT:    1. Precordial pain   2. Primary hypertension   3. BMI 36.0-36.9,adult     PLAN:    In order of problems listed above: 1.  Chest pain: We will obtain a coronary CTA and echocardiogram to evaluate further.  If the patient has mild obstructive coronary artery disease, they will require a statin (with goal LDL < 70) and aspirin, if they have high-grade disease we will need to consider optimal medical therapy and if symptoms are refractory to medical therapy, then a cardiac catheterization with possible PCI will be pursued to alleviate symptoms.  If they have high risk disease we will proceed directly to cardiac catheterization.   2.  Hypertension:  BP is well controlled. 3.  Elevated BMI: Continue exercise and dietary modification             Dispo:  Return if symptoms worsen or fail to improve.      Medication Adjustments/Labs and Tests Ordered: Current medicines are reviewed at length with the patient today.  Concerns regarding medicines are outlined above.  The following changes have been made:  no change   Labs/tests ordered: Orders Placed This Encounter  Procedures   CT CORONARY MORPH W/CTA COR W/SCORE W/CA W/CM &/OR WO/CM   Basic metabolic panel   EKG [id number]    Medication Changes: Meds ordered this encounter  Medications   metoprolol tartrate (LOPRESSOR) 100 MG tablet    Sig: Take 1 tablet (100 mg total) by mouth once for 1 dose. Take 1 tablet (100 mg total) two hours prior to CT scan.    Dispense:  1 tablet    Refill:  0     Current medicines are reviewed at length with the patient today.  The patient does not have concerns regarding medicines.   History of  Present Illness:    FOCUSED PROBLEM LIST:   1.  Hypertension 2.  BMI 36  The patient is a 47 y.o. male with the indicated medical history here for chest pain.  The patient was seen by his primary care provider recently.  He reported episodes of chest discomfort.  EKG demonstrated normal sinus rhythm with nonspecific T wave abnormality.  The patient tells me that he is developed chest pains mostly at rest.  There was 1 episode where he was in bed he developed sharp chest pain that lasted 10 minutes.  He also noticed that there was a weak pain when this was associated with exertion.  It does not typically routinely happen with regular exertion however.  He does smoke marijuana but not tobacco.  He denies any lightheadedness, presyncope, paroxysmal form dyspnea, orthopnea.  He has had no significant peripheral edema or bleeding episodes.  He has not required any emergency room visits or hospitalizations recently.  He is otherwise well without complaints.  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         Current Medications: Current Meds  Medication Sig   amLODipine (NORVASC) 10 MG tablet TAKE 1 TABLET(10 MG) BY MOUTH DAILY   losartan (COZAAR) 25 MG tablet TAKE 1 TABLET(25 MG) BY MOUTH DAILY   metoprolol tartrate (LOPRESSOR) 100  MG tablet Take 1 tablet (100 mg total) by mouth once for 1 dose. Take 1 tablet (100 mg total) two hours prior to CT scan.   sildenafil (VIAGRA) 50 MG tablet Take 1 tablet (50 mg total) by mouth daily as needed for erectile dysfunction.     Allergies:    Patient has no known allergies.   Social History:   Social History   Tobacco Use   Smoking status: Former    Types: Cigarettes    Quit date: 2012    Years since quitting: 12.2   Smokeless tobacco: Never  Vaping Use   Vaping Use: Never used  Substance Use Topics   Alcohol use: Not Currently   Drug use: Not Currently    Types: Marijuana     Family Hx: Family History   Problem Relation Age of Onset   COPD Mother    Healthy Father      Review of Systems:   Please see the history of present illness.    All other systems reviewed and are negative.     EKGs/Labs/Other Test Reviewed:    EKG:  EKG performed March 2024 that I personally reviewed demonstrates sinus rhythm with nonspecific T wave abnormality.  EKG done today demonstrates normal sinus rhythm nonspecific T wave abnormality  Prior CV studies: None available     Other studies Reviewed: Review of the additional studies/records demonstrates: Imaging studies reviewed do not demonstrate aortic atherosclerosis or coronary artery calcification  Recent Labs: 04/17/2022: ALT 11; BUN 7; Creatinine, Ser 1.01; Hemoglobin 15.7; Platelets 297; Potassium 4.0; Sodium 141; TSH 2.260   Recent Lipid Panel Lab Results  Component Value Date/Time   CHOL 175 04/17/2022 10:00 AM   TRIG 193 (H) 04/17/2022 10:00 AM   HDL 28 (L) 04/17/2022 10:00 AM   LDLCALC 113 (H) 04/17/2022 10:00 AM    Risk Assessment/Calculations:                Physical Exam:    VS:  BP 124/72   Pulse 86   Ht 6' (1.829 m)   Wt 265 lb 12.8 oz (120.6 kg)   SpO2 96%   BMI 36.05 kg/m    Wt Readings from Last 3 Encounters:  10/22/22 265 lb 12.8 oz (120.6 kg)  10/16/22 266 lb (120.7 kg)  04/17/22 259 lb 6.4 oz (117.7 kg)    GENERAL:  No apparent distress, AOx3 HEENT:  No carotid bruits, +2 carotid impulses, no scleral icterus CAR: RRR no murmurs, gallops, rubs, or thrills RES:  Clear to auscultation bilaterally ABD:  Soft, nontender, nondistended, positive bowel sounds x 4 VASC:  +2 radial pulses, +2 carotid pulses, palpable pedal pulses NEURO:  CN 2-12 grossly intact; motor and sensory grossly intact PSYCH:  No active depression or anxiety EXT:  No edema, ecchymosis, or cyanosis  Signed, Early Osmond, MD  10/22/2022 10:09 AM    [address], [place], [place]  [address] Phone:  [phone]; Fax: [phone]   Note:  This document was prepared using Dragon voice recognition software and may include unintentional dictation errors.

## 2022-10-22 ENCOUNTER — Ambulatory Visit: Payer: Commercial Managed Care - HMO | Attending: Internal Medicine | Admitting: Internal Medicine

## 2022-10-22 ENCOUNTER — Encounter: Payer: Self-pay | Admitting: Internal Medicine

## 2022-10-22 VITALS — BP 124/72 | HR 86 | Ht 72.0 in | Wt 265.8 lb

## 2022-10-22 DIAGNOSIS — Z6836 Body mass index (BMI) 36.0-36.9, adult: Secondary | ICD-10-CM | POA: Diagnosis not present

## 2022-10-22 DIAGNOSIS — R072 Precordial pain: Secondary | ICD-10-CM | POA: Diagnosis not present

## 2022-10-22 DIAGNOSIS — I10 Essential (primary) hypertension: Secondary | ICD-10-CM | POA: Diagnosis not present

## 2022-10-22 MED ORDER — METOPROLOL TARTRATE 100 MG PO TABS: 100.0000 mg | ORAL_TABLET | Freq: Once | ORAL | 0 refills | Status: DC

## 2022-10-22 NOTE — Patient Instructions (Addendum)
Medication Instructions:  Your physician recommends that you continue on your current medications as directed. Please refer to the Current Medication list given to you today.  *If you need a refill on your cardiac medications before your next appointment, please call your pharmacy*   Lab Work: BMET If you have labs (blood work) drawn today and your tests are completely normal, you will receive your results only by: [place] (if you have MyChart) OR A paper copy in the mail If you have any lab test that is abnormal or we need to change your treatment, we will call you to review the results.   Testing/Procedures: Your physician has requested that you have cardiac CT. Cardiac computed tomography (CT) is a painless test that uses an x-ray machine to take clear, detailed pictures of your heart. For further information please visit [email or web address][facility][email or web address]. Please follow instruction sheet as given.     Follow-Up: At [facility] [place], you and your health needs are our priority.  As part of our continuing mission to provide you with exceptional heart care, we have created designated Provider Care Teams.  These Care Teams include your primary Cardiologist (physician) and Advanced Practice Providers (APPs -  Physician Assistants and Nurse Practitioners) who all work together to provide you with the care you need, when you need it.   Your next appointment:   As needed  Provider:   Dr. Ali Lowe  Other [facility] [place], [place] [address] [phone]  Please arrive at the [facility] and Children's Entrance (Entrance C2) of [facility] 30 minutes prior to test start time. You can use the FREE valet parking offered at entrance C (encouraged to control the heart rate for the test)  Proceed to the [facility] Radiology Department (first floor) to check-in and test prep.  All radiology patients and guests should use entrance C2 at  [facility], accessed from [facility], even though the hospital's physical address listed is [address].      Please follow these instructions carefully (unless otherwise directed):  Hold all erectile dysfunction medications at least 3 days (72 hrs) prior to test. (Ie viagra, cialis, sildenafil, tadalafil, etc) We will administer nitroglycerin during this exam.   On the Night Before the Test: Be sure to Drink plenty of water. Do not consume any caffeinated/decaffeinated beverages or chocolate 12 hours prior to your test. Do not take any antihistamines 12 hours prior to your test.  On the Day of the Test: Drink plenty of water until 1 hour prior to the test. Do not eat any food 1 hour prior to test. You may take your regular medications prior to the test.  Take metoprolol (Lopressor) two hours prior to test.        After the Test: Drink plenty of water. After receiving IV contrast, you may experience a mild flushed feeling. This is normal. On occasion, you may experience a mild rash up to 24 hours after the test. This is not dangerous. If this occurs, you can take Benadryl 25 mg and increase your fluid intake. If you experience trouble breathing, this can be serious. If it is severe call 911 IMMEDIATELY. If it is mild, please call our office. If you take any of these medications: Glipizide/Metformin, Avandament, Glucavance, please do not take 48 hours after completing test unless otherwise instructed.  We will call to schedule your test 2-4 weeks out understanding that some insurance companies  will need an authorization prior to the service being performed.   For non-scheduling related questions, please contact the cardiac imaging nurse navigator should you have any questions/concerns: [name], Cardiac Imaging Nurse Navigator Gordy Clement, Cardiac Imaging Nurse Navigator [facility] Direct Office Dial: [phone]    For scheduling needs, including cancellations and rescheduling, please call [place], [phone].

## 2022-10-23 LAB — BASIC METABOLIC PANEL
BUN/Creatinine Ratio: 8 — ABNORMAL LOW (ref 9–20)
BUN: 8 mg/dL (ref 6–24)
CO2: 25 mmol/L (ref 20–29)
Calcium: 9.4 mg/dL (ref 8.7–10.2)
Chloride: 104 mmol/L (ref 96–106)
Creatinine, Ser: 1 mg/dL (ref 0.76–1.27)
Glucose: 92 mg/dL (ref 70–99)
Potassium: 4.2 mmol/L (ref 3.5–5.2)
Sodium: 143 mmol/L (ref 134–144)
eGFR: 95 mL/min/{1.73_m2} (ref >59)

## 2022-10-27 ENCOUNTER — Telehealth (HOSPITAL_COMMUNITY): Payer: Self-pay | Admitting: Emergency Medicine

## 2022-10-27 NOTE — Telephone Encounter (Signed)
Reaching out to patient to offer assistance regarding upcoming cardiac imaging study; pt verbalizes understanding of appt date/time, parking situation and where to check in, pre-test NPO status and medications ordered, and verified current allergies; name and call back number provided for further questions should they arise Larry Bond RN Navigator Cardiac Imaging [facility] [phone] office [phone] cell  Arrival 1200 [address] entrance 100mg  metoprolol tartrate Denies iv issues aware contrast/nitro

## 2022-10-28 ENCOUNTER — Ambulatory Visit (HOSPITAL_COMMUNITY)
Admission: RE | Admit: 2022-10-28 | Discharge: 2022-10-28 | Disposition: A | Discharge status: Home / Self Care | Payer: Commercial Managed Care - HMO | Source: Ambulatory Visit | Attending: Internal Medicine | Admitting: Internal Medicine

## 2022-10-28 DIAGNOSIS — R072 Precordial pain: Secondary | ICD-10-CM

## 2022-10-28 MED ORDER — IOHEXOL 350 MG/ML SOLN
100.0000 mL | Freq: Once | INTRAVENOUS | Status: AC | PRN
  Administered 2022-10-28: 100 mL via INTRAVENOUS

## 2022-10-28 MED ORDER — NITROGLYCERIN 0.4 MG SL SUBL
0.8000 mg | SUBLINGUAL_TABLET | Freq: Once | SUBLINGUAL | Status: AC
  Administered 2022-10-28: 0.8 mg via SUBLINGUAL

## 2022-10-28 MED ORDER — NITROGLYCERIN 0.4 MG SL SUBL
SUBLINGUAL_TABLET | SUBLINGUAL | Status: AC
  Filled 2022-10-28: qty 2

## 2022-10-31 LAB — COLOGUARD: COLOGUARD: NEGATIVE

## 2022-11-16 ENCOUNTER — Encounter: Payer: Self-pay | Admitting: *Deleted

## 2022-11-27 ENCOUNTER — Ambulatory Visit: Payer: Commercial Managed Care - HMO | Admitting: Orthopaedic Surgery

## 2023-02-12 ENCOUNTER — Other Ambulatory Visit: Payer: Self-pay | Admitting: Nurse Practitioner

## 2023-02-12 ENCOUNTER — Encounter: Payer: Self-pay | Admitting: Nurse Practitioner

## 2023-02-18 ENCOUNTER — Telehealth: Payer: Self-pay

## 2023-02-18 ENCOUNTER — Other Ambulatory Visit: Payer: Self-pay

## 2023-02-18 DIAGNOSIS — I10 Essential (primary) hypertension: Secondary | ICD-10-CM

## 2023-02-18 MED ORDER — LOSARTAN POTASSIUM 25 MG PO TABS
ORAL_TABLET | ORAL | 1 refills | Status: DC
Start: 2023-02-18 — End: 2023-08-16

## 2023-02-18 MED ORDER — AMLODIPINE BESYLATE 10 MG PO TABS: 10.0000 mg | ORAL_TABLET | Freq: Every day | ORAL | 1 refills | Status: DC

## 2023-02-18 NOTE — Telephone Encounter (Signed)
Rx's has been sent 

## 2023-02-18 NOTE — Telephone Encounter (Signed)
Prescription Request  02/18/2023  LOV: 10/16/22  What is the name of the medication or equipment?  amLODipine (NORVASC) 10 MG tablet    losartan (COZAAR) 25 MG tablet   Have you contacted your pharmacy to request a refill? Yes   Which pharmacy would you like this sent to?  [id number] - [place], [place] - [address] AT [facility] [address] [facility] [address] [place] [address] Phone: [phone] Fax: [phone]  Patient notified that their request is being sent to the clinical staff for review and that they should receive a response within 2 business days.   Please advise at Mobile [phone] (mobile)

## 2023-04-07 ENCOUNTER — Other Ambulatory Visit: Payer: Self-pay

## 2023-04-07 DIAGNOSIS — E782 Mixed hyperlipidemia: Secondary | ICD-10-CM

## 2023-04-07 DIAGNOSIS — I10 Essential (primary) hypertension: Secondary | ICD-10-CM

## 2023-04-07 DIAGNOSIS — Z0000 Encounter for general adult medical examination without abnormal findings: Secondary | ICD-10-CM

## 2023-04-13 ENCOUNTER — Other Ambulatory Visit: Payer: Commercial Managed Care - HMO

## 2023-04-13 DIAGNOSIS — E782 Mixed hyperlipidemia: Secondary | ICD-10-CM

## 2023-04-13 DIAGNOSIS — Z0000 Encounter for general adult medical examination without abnormal findings: Secondary | ICD-10-CM

## 2023-04-13 DIAGNOSIS — I10 Essential (primary) hypertension: Secondary | ICD-10-CM

## 2023-04-14 LAB — COMPREHENSIVE METABOLIC PANEL
ALT: 22 IU/L (ref 0–44)
AST: 19 IU/L (ref 0–40)
Albumin: 4.4 g/dL (ref 4.1–5.1)
Alkaline Phosphatase: 90 IU/L (ref 44–121)
BUN/Creatinine Ratio: 8 — ABNORMAL LOW (ref 9–20)
BUN: 8 mg/dL (ref 6–24)
Bilirubin Total: 0.8 mg/dL (ref 0.0–1.2)
CO2: 26 mmol/L (ref 20–29)
Calcium: 9.5 mg/dL (ref 8.7–10.2)
Chloride: 104 mmol/L (ref 96–106)
Creatinine, Ser: 0.95 mg/dL (ref 0.76–1.27)
Globulin, Total: 2.4 g/dL (ref 1.5–4.5)
Glucose: 103 mg/dL — ABNORMAL HIGH (ref 70–99)
Potassium: 4 mmol/L (ref 3.5–5.2)
Sodium: 143 mmol/L (ref 134–144)
Total Protein: 6.8 g/dL (ref 6.0–8.5)
eGFR: 101 mL/min/{1.73_m2} (ref >59)

## 2023-04-14 LAB — CBC WITH DIFFERENTIAL/PLATELET
Basophils Absolute: 0 10*3/uL (ref 0.0–0.2)
Basos: 1 %
EOS (ABSOLUTE): 0.1 10*3/uL (ref 0.0–0.4)
Eos: 1 %
Hematocrit: 45.3 % (ref 37.5–51.0)
Hemoglobin: 15 g/dL (ref 13.0–17.7)
Immature Grans (Abs): 0 10*3/uL (ref 0.0–0.1)
Immature Granulocytes: 0 %
Lymphocytes Absolute: 3 10*3/uL (ref 0.7–3.1)
Lymphs: 44 %
MCH: 33 pg (ref 26.6–33.0)
MCHC: 33.1 g/dL (ref 31.5–35.7)
MCV: 100 fL — ABNORMAL HIGH (ref 79–97)
Monocytes Absolute: 0.4 10*3/uL (ref 0.1–0.9)
Monocytes: 6 %
Neutrophils Absolute: 3.2 10*3/uL (ref 1.4–7.0)
Neutrophils: 48 %
Platelets: 257 10*3/uL (ref 150–450)
RBC: 4.54 x10E6/uL (ref 4.14–5.80)
RDW: 13.9 % (ref 11.6–15.4)
WBC: 6.7 10*3/uL (ref 3.4–10.8)

## 2023-04-14 LAB — LIPID PANEL
Chol/HDL Ratio: 5.5 ratio — ABNORMAL HIGH (ref 0.0–5.0)
Cholesterol, Total: 171 mg/dL (ref 100–199)
HDL: 31 mg/dL — ABNORMAL LOW (ref >39)
LDL Chol Calc (NIH): 110 mg/dL — ABNORMAL HIGH (ref 0–99)
Triglycerides: 172 mg/dL — ABNORMAL HIGH (ref 0–149)
VLDL Cholesterol Cal: 30 mg/dL (ref 5–40)

## 2023-04-14 LAB — TSH: TSH: 2.27 u[IU]/mL (ref 0.450–4.500)

## 2023-04-14 LAB — HEMOGLOBIN A1C
Est. average glucose Bld gHb Est-mCnc: 111 mg/dL
Hgb A1c MFr Bld: 5.5 % (ref 4.8–5.6)

## 2023-04-19 ENCOUNTER — Encounter: Payer: Self-pay | Admitting: Family Medicine

## 2023-04-19 ENCOUNTER — Ambulatory Visit (INDEPENDENT_AMBULATORY_CARE_PROVIDER_SITE_OTHER): Payer: Commercial Managed Care - HMO | Admitting: Family Medicine

## 2023-04-19 VITALS — BP 136/81 | HR 91 | Ht 72.0 in | Wt 263.1 lb

## 2023-04-19 DIAGNOSIS — M25561 Pain in right knee: Secondary | ICD-10-CM

## 2023-04-19 DIAGNOSIS — E782 Mixed hyperlipidemia: Secondary | ICD-10-CM

## 2023-04-19 DIAGNOSIS — G8929 Other chronic pain: Secondary | ICD-10-CM | POA: Diagnosis not present

## 2023-04-19 DIAGNOSIS — I10 Essential (primary) hypertension: Secondary | ICD-10-CM | POA: Diagnosis not present

## 2023-04-19 NOTE — Assessment & Plan Note (Signed)
Tolerating amlodipine and losartan well.  Continue current medications.  Follow-up in 6 months.

## 2023-04-19 NOTE — Progress Notes (Unsigned)
Annual physical  Subjective   Patient ID: Larry Phillips, male    DOB: [birth year]  Age: 47 y.o. MRN: [id number]  Chief Complaint  Patient presents with   Annual Exam   HPI Larry Phillips is a 48 y.o. old male here  for annual exam.   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  He is married.  Sexually active with his wife.  Does not use tobacco.  Does not drink alcohol.  Uses marijuana daily.  He eats a regular diet.  Tries to limit how many meals he has a day, generally eats 1 large meal a day and snacks the rest of days.  Snacks include reese cups.  Discussed healthier alternatives to reese cups.  Also exercises may be 0-2 times a week with a home gym.    Patient has an eye doctor but is in the process of getting a dentist.  He had a Cologuard earlier this year.   Body mass index is 35.69 kg/m.   The patient has the following chronic health issues that are monitored on a routine basis:  HTN -taking amlodipine, losartan.  No complaints.  No side effects, no leg swelling.  Erectile dysfucntion -uses Viagra.  Working well.  Still has refills from previous prescription.      The 10-year ASCVD risk score (Arnett DK, et al., 2019) is: 8.4%  Health Maintenance Due  Topic Date Due   DTaP/Tdap/Td (1 - Tdap) Never done   COVID-19 Vaccine (1 - 2023-24 season) Never done      Objective:     BP 136/81   Pulse 91   Ht 6' (1.829 m)   Wt 263 lb 1.9 oz (119.4 kg)   SpO2 99%   BMI 35.69 kg/m    Physical Exam General: Alert and oriented HEENT: PERRLA, EOMI, moist mucosa CV: Regular rate normal, no murmurs Pulmonary: Lungs clear bilaterally GI: Soft, normal bowel sounds MSK: Strength equal bilaterally Psych: Pleasant affect Skin: Warm and dry.   Assessment & Plan:   Essential hypertension Assessment & Plan: Tolerating amlodipine and losartan well.  Continue current  medications.  Follow-up in 6 months.   Mixed hyperlipidemia Assessment & Plan: Overall ASCVD score less than 7.5%.  Discussed lifestyle changes including weight loss, healthier eating, exercise.  Follow-up in 6 months.   Chronic pain of right knee Assessment & Plan: Improved since he stopped working at [employer] and doing less jumping/running.  Still bothers him on some days but tolerable.      Return in about 6 months (around 10/17/2023) for HTN.    Sandre Kitty, MD

## 2023-04-19 NOTE — Assessment & Plan Note (Signed)
Overall ASCVD score less than 7.5%.  Discussed lifestyle changes including weight loss, healthier eating, exercise.  Follow-up in 6 months.

## 2023-04-19 NOTE — Patient Instructions (Addendum)
It was nice to see you today,  We addressed the following topics today: -Your blood pressure was good today.  Continue taking your medications. - I would recommend exercising 20 to 30 minutes a day 5 days a week, whether it is walking briskly or weight lifting at home. - Losing as little as 10 to 15 pounds can help with your knee pain, improve your cholesterol levels and overall improve your health. - I would like to see you back in 6 months.   Have a great day,  Frederic Jericho, MD

## 2023-04-19 NOTE — Assessment & Plan Note (Signed)
Improved since he stopped working at [employer] and doing less jumping/running.  Still bothers him on some days but tolerable.

## 2023-05-13 ENCOUNTER — Encounter (HOSPITAL_COMMUNITY): Payer: Self-pay

## 2023-05-13 ENCOUNTER — Emergency Department (HOSPITAL_COMMUNITY)
Admission: EM | Admit: 2023-05-13 | Discharge: 2023-05-13 | Disposition: A | Discharge status: Home / Self Care | Payer: Commercial Managed Care - HMO | Attending: Emergency Medicine | Admitting: Emergency Medicine

## 2023-05-13 ENCOUNTER — Encounter: Payer: Self-pay | Admitting: Family Medicine

## 2023-05-13 ENCOUNTER — Emergency Department (HOSPITAL_COMMUNITY): Payer: Commercial Managed Care - HMO

## 2023-05-13 DIAGNOSIS — Z79899 Other long term (current) drug therapy: Secondary | ICD-10-CM | POA: Diagnosis not present

## 2023-05-13 DIAGNOSIS — I880 Nonspecific mesenteric lymphadenitis: Secondary | ICD-10-CM

## 2023-05-13 DIAGNOSIS — E876 Hypokalemia: Secondary | ICD-10-CM | POA: Diagnosis not present

## 2023-05-13 DIAGNOSIS — R110 Nausea: Secondary | ICD-10-CM | POA: Diagnosis not present

## 2023-05-13 DIAGNOSIS — R1031 Right lower quadrant pain: Secondary | ICD-10-CM | POA: Diagnosis present

## 2023-05-13 DIAGNOSIS — R197 Diarrhea, unspecified: Secondary | ICD-10-CM | POA: Insufficient documentation

## 2023-05-13 LAB — URINALYSIS, ROUTINE W REFLEX MICROSCOPIC
Bilirubin Urine: NEGATIVE
Glucose, UA: NEGATIVE mg/dL
Hgb urine dipstick: NEGATIVE
Ketones, ur: NEGATIVE mg/dL
Leukocytes,Ua: NEGATIVE
Nitrite: NEGATIVE
Protein, ur: NEGATIVE mg/dL
Specific Gravity, Urine: 1.021 (ref 1.005–1.030)
pH: 6 (ref 5.0–8.0)

## 2023-05-13 LAB — CBC WITH DIFFERENTIAL/PLATELET
Abs Immature Granulocytes: 0.01 10*3/uL (ref 0.00–0.07)
Basophils Absolute: 0 10*3/uL (ref 0.0–0.1)
Basophils Relative: 1 %
Eosinophils Absolute: 0.1 10*3/uL (ref 0.0–0.5)
Eosinophils Relative: 2 %
HCT: 44.4 % (ref 39.0–52.0)
Hemoglobin: 14.8 g/dL (ref 13.0–17.0)
Immature Granulocytes: 0 %
Lymphocytes Relative: 42 %
Lymphs Abs: 2.6 10*3/uL (ref 0.7–4.0)
MCH: 32.8 pg (ref 26.0–34.0)
MCHC: 33.3 g/dL (ref 30.0–36.0)
MCV: 98.4 fL (ref 80.0–100.0)
Monocytes Absolute: 0.4 10*3/uL (ref 0.1–1.0)
Monocytes Relative: 6 %
Neutro Abs: 3.1 10*3/uL (ref 1.7–7.7)
Neutrophils Relative %: 49 %
Platelets: 301 10*3/uL (ref 150–400)
RBC: 4.51 MIL/uL (ref 4.22–5.81)
RDW: 13.5 % (ref 11.5–15.5)
WBC: 6.2 10*3/uL (ref 4.0–10.5)
nRBC: 0 % (ref 0.0–0.2)

## 2023-05-13 LAB — COMPREHENSIVE METABOLIC PANEL
ALT: 23 U/L (ref 0–44)
AST: 22 U/L (ref 15–41)
Albumin: 4.2 g/dL (ref 3.5–5.0)
Alkaline Phosphatase: 70 U/L (ref 38–126)
Anion gap: 8 (ref 5–15)
BUN: 9 mg/dL (ref 6–20)
CO2: 29 mmol/L (ref 22–32)
Calcium: 8.6 mg/dL — ABNORMAL LOW (ref 8.9–10.3)
Chloride: 102 mmol/L (ref 98–111)
Creatinine, Ser: 0.98 mg/dL (ref 0.61–1.24)
GFR, Estimated: >60 mL/min (ref >60)
Glucose, Bld: 106 mg/dL — ABNORMAL HIGH (ref 70–99)
Potassium: 3.1 mmol/L — ABNORMAL LOW (ref 3.5–5.1)
Sodium: 139 mmol/L (ref 135–145)
Total Bilirubin: 1.3 mg/dL — ABNORMAL HIGH (ref 0.3–1.2)
Total Protein: 7 g/dL (ref 6.5–8.1)

## 2023-05-13 MED ORDER — SODIUM CHLORIDE (PF) 0.9 % IJ SOLN
INTRAMUSCULAR | Status: AC
  Filled 2023-05-13: qty 50

## 2023-05-13 MED ORDER — IOHEXOL 300 MG/ML  SOLN
100.0000 mL | Freq: Once | INTRAMUSCULAR | Status: AC | PRN
  Administered 2023-05-13: 100 mL via INTRAVENOUS

## 2023-05-13 MED ORDER — IBUPROFEN 800 MG PO TABS: 800.0000 mg | ORAL_TABLET | Freq: Three times a day (TID) | ORAL | 0 refills | Status: DC | PRN

## 2023-05-13 NOTE — Discharge Instructions (Signed)
You have been seen and discharged from the emergency department.  You have been diagnosed with inflamed mesenteric lymph nodes that surround the bowel.  This is usually caused by a viral gastroenteritis/stomach flu.  It is recommended that you take anti-inflammatory pain medicine and stay well-hydrated.  This should resolve in the next couple days.  It is recommended that you get repeat CT imaging in a couple months to confirm that these lymph nodes have returned to normal.  Follow-up with your primary provider for further evaluation and further care. Take home medications as prescribed. If you have any worsening symptoms, unresolved symptoms, severely worsening pain, bloody diarrhea or further concerns for your health please return to an emergency department for further evaluation.

## 2023-05-13 NOTE — ED Provider Triage Note (Signed)
Emergency Medicine Provider Triage Evaluation Note  Larry Phillips , a 47 y.o. male  was evaluated in triage.  Pt complains of RLQ pain intermittent since yesterday.  Reports some nausea but no vomiting.  Reports he usually has diarrhea but has not been having more constipation with hard stools.  He denies any radiation into his penis or scrotum or testicles.  Denies any pain into his penis, scrotum, or testicles.  Denies any dysuria.  He denies any melena or hematochezia.  Denies any surgeries to his belly.  Denies any fevers.  Review of Systems  Positive:  Negative:   Physical Exam  There were no vitals taken for this visit. Gen:   Awake, no distress   Resp:  Normal effort  MSK:   Moves extremities without difficulty  Other:  Abdomen soft.  Right lower quadrant tenderness palpation.  Without any guarding or rebound.  Medical Decision Making  Medically screening exam initiated at 11:36 AM.  Appropriate orders placed.  Micky L Dibiasio was informed that the remainder of the evaluation will be completed by another provider, this initial triage assessment does not replace that evaluation, and the importance of remaining in the ED until their evaluation is complete.  CT and labs ordered.   Achille Rich, PA-C 05/13/23 1137

## 2023-05-13 NOTE — ED Triage Notes (Signed)
BIB POV c/o lower abdominal pain, nausea, constipation x 1 day. Admits to abdominal swelling on lump to RLQ.

## 2023-05-13 NOTE — ED Provider Notes (Signed)
[place] EMERGENCY DEPARTMENT AT [facility] Provider Note   CSN: [id number] Arrival date & time: 05/13/23  1126     History  Chief Complaint  Patient presents with   Abdominal Pain    Larry Phillips is a 47 y.o. male.  HPI   47 year old male with no previous abdominal surgeries presents emergency department right lower quadrant abdominal pain.  Patient states for the last 2 days he has had diarrhea and right lower quadrant pain that has become worse.  He states it is a crampy/sharp sensation.  No bloody bowel movements.  He has had mild nausea and change in appetite but no vomiting.  Denies any fever or chills but endorses fatigue.  Denies any genitourinary symptoms.  Home Medications Prior to Admission medications   Medication Sig Start Date End Date Taking? Authorizing Provider  amLODipine (NORVASC) 10 MG tablet Take 1 tablet (10 mg total) by mouth daily. 02/18/23   Sandre Kitty, MD  losartan (COZAAR) 25 MG tablet TAKE 1 TABLET(25 MG) BY MOUTH DAILY 02/18/23   Sandre Kitty, MD  sildenafil (VIAGRA) 50 MG tablet Take 1 tablet (50 mg total) by mouth daily as needed for erectile dysfunction. 10/16/22   Carlean Jews, NP      Allergies    Patient has no known allergies.    Review of Systems   Review of Systems  Constitutional:  Positive for appetite change and fatigue. Negative for fever.  Respiratory:  Negative for shortness of breath.   Cardiovascular:  Negative for chest pain.  Gastrointestinal:  Positive for abdominal pain, constipation, diarrhea and nausea. Negative for vomiting.  Genitourinary:  Negative for dysuria, flank pain, scrotal swelling and testicular pain.  Skin:  Negative for rash.  Neurological:  Negative for headaches.    Physical Exam Updated Vital Signs BP (!) 149/102   Pulse 84   Temp 98 F (36.7 C) (Oral)   Resp 18   SpO2 99%  Physical Exam Vitals and nursing note reviewed.  Constitutional:      Appearance: Normal  appearance. He is not ill-appearing.  HENT:     Head: Normocephalic.     Mouth/Throat:     Mouth: Mucous membranes are moist.  Cardiovascular:     Rate and Rhythm: Normal rate.  Pulmonary:     Effort: Pulmonary effort is normal. No respiratory distress.  Abdominal:     General: Bowel sounds are normal. There is no distension.     Palpations: Abdomen is soft.     Tenderness: There is abdominal tenderness in the right lower quadrant. There is no guarding or rebound.  Skin:    General: Skin is warm.  Neurological:     Mental Status: He is alert and oriented to person, place, and time. Mental status is at baseline.  Psychiatric:        Mood and Affect: Mood normal.     ED Results / Procedures / Treatments   Labs (all labs ordered are listed, but only abnormal results are displayed) Labs Reviewed  COMPREHENSIVE METABOLIC PANEL - Abnormal; Notable for the following components:      Result Value   Potassium 3.1 (*)    Glucose, Bld 106 (*)    Calcium 8.6 (*)    Total Bilirubin 1.3 (*)    All other components within normal limits  CBC WITH DIFFERENTIAL/PLATELET  URINALYSIS, ROUTINE W REFLEX MICROSCOPIC    EKG None  Radiology No results found.  Procedures Procedures  Medications Ordered in ED Medications  iohexol (OMNIPAQUE) 300 MG/ML solution 100 mL (100 mLs Intravenous Contrast Given 05/13/23 1643)    ED Course/ Medical Decision Making/ A&P                                 Medical Decision Making Risk Prescription drug management.   47 year old male presents emergency department with right lower quadrant abdominal pain associated with diarrhea.  Worsening over the last 2 days.  Denies any bloody bowel movements.  No previous surgeries in his abdomen, no findings of hernia.  No genitourinary symptoms.  Vitals are normal and stable.  Blood work is reassuring, no leukocytosis. Mild hypokalemia. CT of the abdomen pelvis shows some mesenteric haziness and changes  with the lymph nodes, sounds like mesenteric adenitis.  This goes along with his current symptoms. No appendicitis. I have lower suspicion for mesenteric ischemia at this time.  Will recommend medication for symptom control, hydration and outpatient follow-up.  Patient understands if symptoms progress or he has any bloody bowel movements he is to return immediately for evaluation.  Patient at this time appears safe and stable for discharge and close outpatient follow up. Discharge plan and strict return to ED precautions discussed, patient verbalizes understanding and agreement.        Final Clinical Impression(s) / ED Diagnoses Final diagnoses:  None    Rx / DC Orders ED Discharge Orders     None         Rozelle Logan, DO 05/13/23 2001

## 2023-06-17 ENCOUNTER — Encounter: Payer: Self-pay | Admitting: Family Medicine

## 2023-08-13 ENCOUNTER — Other Ambulatory Visit: Payer: Self-pay | Admitting: Family Medicine

## 2023-08-13 DIAGNOSIS — I10 Essential (primary) hypertension: Secondary | ICD-10-CM

## 2023-10-17 NOTE — Progress Notes (Deleted)
   Established Patient Office Visit  Subjective   Patient ID: Larry Phillips, male    DOB: [birth year]  Age: 47 y.o. MRN: [id number]  No chief complaint on file.   HPI  HTN - amlodipine, losartan  ED - viagra  Emotional support animal -    The 10-year ASCVD risk score (Arnett DK, et al., 2019) is: 9.6%  Health Maintenance Due  Topic Date Due   DTaP/Tdap/Td (1 - Tdap) Never done   COVID-19 Vaccine (1 - 2024-25 season) Never done      Objective:     There were no vitals taken for this visit. {Vitals History (Optional):23777}  Physical Exam   No results found for any visits on 10/18/23.      Assessment & Plan:   There are no diagnoses linked to this encounter.   No follow-ups on file.    Sandre Kitty, MD

## 2023-10-18 ENCOUNTER — Ambulatory Visit: Payer: Commercial Managed Care - HMO | Admitting: Family Medicine

## 2023-12-29 ENCOUNTER — Other Ambulatory Visit: Payer: Self-pay | Admitting: Nurse Practitioner

## 2023-12-29 DIAGNOSIS — N529 Male erectile dysfunction, unspecified: Secondary | ICD-10-CM

## 2024-02-18 ENCOUNTER — Encounter: Payer: Self-pay | Admitting: Advanced Practice Midwife

## 2024-02-27 ENCOUNTER — Other Ambulatory Visit: Payer: Self-pay | Admitting: Family Medicine

## 2024-02-27 DIAGNOSIS — I10 Essential (primary) hypertension: Secondary | ICD-10-CM

## 2024-05-01 ENCOUNTER — Ambulatory Visit: Payer: Self-pay | Admitting: Family Medicine

## 2024-05-04 ENCOUNTER — Telehealth: Payer: Self-pay | Admitting: Physician Assistant

## 2024-05-04 DIAGNOSIS — K047 Periapical abscess without sinus: Secondary | ICD-10-CM

## 2024-05-04 MED ORDER — NAPROXEN 500 MG PO TABS
500.0000 mg | ORAL_TABLET | Freq: Two times a day (BID) | ORAL | 0 refills | Status: AC
Start: 2024-05-04 — End: ?

## 2024-05-04 MED ORDER — AMOXICILLIN-POT CLAVULANATE 875-125 MG PO TABS
1.0000 | ORAL_TABLET | Freq: Two times a day (BID) | ORAL | 0 refills | Status: DC
Start: 2024-05-04 — End: 2024-06-19

## 2024-05-04 NOTE — Patient Instructions (Addendum)
 Larry Phillips, thank you for joining Larry Velma Lunger, PA-C for today's virtual visit.  While this provider is not your primary care provider (PCP), if your PCP is located in our provider database this encounter information will be shared with them immediately following your visit.   A Larry Phillips account gives you access to today's visit and all your visits, tests, and labs performed at [facility]  click here if you don't have a  Phillips account or go to [email or web address]  Consent: (Patient) Larry Phillips provided verbal consent for this virtual visit at the beginning of the encounter.  Current Medications:  Current Outpatient Medications:    amLODipine  (NORVASC ) 10 MG tablet, TAKE 1 TABLET(10 MG) BY MOUTH DAILY, Disp: 90 tablet, Rfl: 1   ibuprofen  (ADVIL ) 800 MG tablet, Take 1 tablet (800 mg total) by mouth every 8 (eight) hours as needed., Disp: 15 tablet, Rfl: 0   losartan  (COZAAR ) 25 MG tablet, TAKE 1 TABLET(25 MG) BY MOUTH DAILY, Disp: 90 tablet, Rfl: 1   sildenafil  (VIAGRA ) 50 MG tablet, TAKE 1 TABLET BY MOUTH ONCE DAILY AS NEEDED FOR ERECTILE DYSFUNCTION, Disp: 30 tablet, Rfl: 0   Medications ordered in this encounter:  No orders of the defined types were placed in this encounter.    *If you need refills on other medications prior to your next appointment, please contact your pharmacy*  Follow-Up: Call back or seek an in-person evaluation if the symptoms worsen or if the condition fails to improve as anticipated.   Virtual Care [phone]  Other Instructions Please keep hydrated and avoid chewing on affected side. Continue good oral hygiene. Ok to continue OTC Tylenol  for pain. I have sent in an anti-inflammatory medication and an antibiotic to take as directed.  Please reference the dental resources provided below. If you note any non-resolving, new, or worsening symptoms despite treatment, please seek an  in-person evaluation ASAP. I hope you feel better soon!  [facility] [phone]   [facility] [phone]   [facility] [phone]   [facility] [place] [place] [phone]   [facility] [phone]   [facility] [phone]   [facility] [phone]   [facility] [place] [phone]   Medicaid Children under 21 [facility] (2 locations: [place] and [place])   [place]: [phone]   [place]: [phone]       [place]       [facility] [phone]   [facility] [phone]   [facility] [phone] (Resource; not on site)   [facility] [phone]       [facility] [phone]       [facility]       [facility] [phone]       [facility]       Emergency Dentist 24[facility] [phone]   [facility] [place] [phone]   [facility] [place] [phone]       [facility]       Dental Care at [facility] [phone]   [facility] [phone]   [facility] [phone]   [facility] [place] [phone]      Dental Abscess  A dental abscess is an area of pus in or around a tooth. It comes from an infection. It can cause pain and other symptoms. Treatment will help with  symptoms and prevent the infection from spreading. What are the causes? This condition is caused by an infection in or around the tooth. This can be from: Very bad tooth decay (cavities). A bad injury to the tooth, such as a broken or chipped tooth. What increases the risk? The risk to get an abscess is higher in males. It is also more likely in people who: Have dental decay. Have very bad gum disease. Eat sugary snacks between meals. Use tobacco. Have diabetes. Have a weak disease-fighting  system (immune system). Do not brush their teeth regularly. What are the signs or symptoms? Some mild symptoms are: Tenderness. Bad breath. Fever. A sharp, sour taste in the mouth. Pain in and around the infected tooth. Worse symptoms of this condition include: Swollen neck glands. Chills. Pus draining around the tooth. Swelling and redness around the tooth, the mouth, or the face. Very bad pain in and around the tooth. The worst symptoms can include: Difficulty swallowing. Difficulty opening your mouth. Feeling like you may vomit or vomiting. How is this treated? This is treated by getting rid of the infection. Your dentist will discuss ways to do this, including: Antibiotic medicines. Antibacterial mouth rinse. An incision in the abscess to drain out the pus. A root canal. Removing the tooth. Follow these instructions at home: Medicines Take over-the-counter and prescription medicines only as told by your dentist. If you were prescribed an antibiotic medicine, take it as told by your dentist. Do not stop taking it even if you start to feel better. If you were prescribed a gel that has numbing medicine in it, use it exactly as told. Ask your dentist if you should avoid driving or using machines while you are taking your medicine. General instructions Rinse your mouth often with salt water. To make salt water, dissolve -1 tsp (3-6 g) of salt in 1 cup (237 mL) of warm water. Eat a soft diet while your mouth is healing. Drink enough fluid to keep your pee (urine) pale yellow. Do not apply heat to the outside of your mouth. Do not smoke or use any products that contain nicotine or tobacco. If you need help quitting, ask your dentist. Keep all follow-up visits. Prevent an abscess Brush your teeth every morning and every night. Use fluoride toothpaste. Floss your teeth each day. Get dental cleanings as often as told by your dentist. Think about getting dental sealant put on  teeth that have deep holes (decay). Drink water that has fluoride in it. Most tap water has fluoride. Check the label on bottled water to see if it has fluoride in it. Drink water instead of sugary drinks. Eat healthy meals and snacks. Wear a mouth guard or face shield when you play sports. Contact a doctor if: Your pain is worse and medicine does not help. Get help right away if: You have a fever or chills. Your symptoms suddenly get worse. You have a very bad headache. You have problems breathing or swallowing. You have trouble opening your mouth. You have swelling in your neck or close to your eye. These symptoms may be an emergency. Get help right away. Call your local emergency services (911 in the [place] Do not wait to see if the symptoms will go away. Do not drive yourself to the hospital. Summary A dental abscess is an area of pus in or around a tooth. It is caused by an infection. Treatment will help with symptoms and prevent the infection from spreading. Take over-the-counter and prescription  medicines only as told by your dentist. To prevent an abscess, take good care of your teeth. Brush your teeth every morning and night. Use floss every day. Get dental cleanings as often as told by your dentist. This information is not intended to replace advice given to you by your health care provider. Make sure you discuss any questions you have with your health care provider. Document Revised: 09/25/2020 Document Reviewed: 09/26/2020 Elsevier Patient Education  2024 Elsevier Inc.   If you have been instructed to have an in-person evaluation today at a local Urgent Care facility, please use the link below. It will take you to a list of all of our available [facility], including address, phone number and hours of operation. Please do not delay care.  [facility]  If you or a family member do not have a primary care provider, use the link below to schedule a  visit and establish care. When you choose a [facility] primary care physician or advanced practice provider, you gain a long-term partner in health. Find a Primary Care Provider  Learn more about [facility]'s in-office and virtual care options: [facility] - Get Care Now

## 2024-05-04 NOTE — Progress Notes (Signed)
 Virtual Visit Consent   Larry Phillips, you are scheduled for a virtual visit with a [facility] provider today. Just as with appointments in the office, your consent must be obtained to participate. Your consent will be active for this visit and any virtual visit you may have with one of our providers in the next 365 days. If you have a MyChart account, a copy of this consent can be sent to you electronically.  As this is a virtual visit, video technology does not allow for your provider to perform a traditional examination. This may limit your provider's ability to fully assess your condition. If your provider identifies any concerns that need to be evaluated in person or the need to arrange testing (such as labs, EKG, etc.), we will make arrangements to do so. Although advances in technology are sophisticated, we cannot ensure that it will always work on either your end or our end. If the connection with a video visit is poor, the visit may have to be switched to a telephone visit. With either a video or telephone visit, we are not always able to ensure that we have a secure connection.  By engaging in this virtual visit, you consent to the provision of healthcare and authorize for your insurance to be billed (if applicable) for the services provided during this visit. Depending on your insurance coverage, you may receive a charge related to this service.  I need to obtain your verbal consent now. Are you willing to proceed with your visit today? [name] has provided verbal consent on 05/04/2024 for a virtual visit (video or telephone). Larry Phillips, [place]  Date: 05/04/2024 4:36 PM   Virtual Visit via Video Note   I, Larry Phillips, connected with  EBON KETCHUM  ([id number], May 15, 1976) on 05/04/24 at  4:45 PM EDT by a video-enabled telemedicine application and verified that I am speaking with the correct person using two identifiers.  Location: Patient: Virtual Visit  Location Patient: Home Provider: Virtual Visit Location Provider: Home Office   I discussed the limitations of evaluation and management by telemedicine and the availability of in person appointments. The patient expressed understanding and agreed to proceed.    History of Present Illness: Larry Phillips is a 48 y.o. who identifies as a male who was assigned male at birth, and is being seen today for left lower dental pain with associated swelling around the gums and face. Notes he chipped a tooth in that area about 2 months ago, but has been fine outside of some initial pain. Denies fever, chills. Is currently without a dental provider.  OTC -- Tylenol  ES   HPI: HPI  Problems:  Patient Active Problem List   Diagnosis Date Noted   Abnormal ECG 10/16/2022   Erectile dysfunction 05/03/2022   Body mass index (BMI) of 30.0-30.9 in adult 07/16/2021   Encounter for general adult medical examination with abnormal findings 04/27/2021   Mixed hyperlipidemia 04/27/2021   Chronic pain of right knee 04/27/2021   Salivary duct stone 01/08/2021   Generalized anxiety disorder 01/08/2021   Essential hypertension 03/11/2020   Atypical mole 07/14/2019    Allergies: No Known Allergies Medications:  Current Outpatient Medications:    amoxicillin -clavulanate (AUGMENTIN ) 875-125 MG tablet, Take 1 tablet by mouth 2 (two) times daily., Disp: 14 tablet, Rfl: 0   naproxen  (NAPROSYN ) 500 MG tablet, Take 1 tablet (500 mg total) by mouth 2 (two) times daily with a meal., Disp: 20 tablet, Rfl:  0   amLODipine  (NORVASC ) 10 MG tablet, TAKE 1 TABLET(10 MG) BY MOUTH DAILY, Disp: 90 tablet, Rfl: 1   losartan  (COZAAR ) 25 MG tablet, TAKE 1 TABLET(25 MG) BY MOUTH DAILY, Disp: 90 tablet, Rfl: 1   sildenafil  (VIAGRA ) 50 MG tablet, TAKE 1 TABLET BY MOUTH ONCE DAILY AS NEEDED FOR ERECTILE DYSFUNCTION, Disp: 30 tablet, Rfl: 0  Observations/Objective: Patient is well-developed, well-nourished in no acute distress.   Resting comfortably at home.  Head is normocephalic, atraumatic.  No labored breathing. Speech is clear and coherent with logical content.  Patient is alert and oriented at baseline.  Hard to visualize the tooth in concern. Poor dentition noted overall. Mild facial swelling of L cheek and jaw line.  Assessment and Plan: 1. Dental infection (Primary) - amoxicillin -clavulanate (AUGMENTIN ) 875-125 MG tablet; Take 1 tablet by mouth 2 (two) times daily.  Dispense: 14 tablet; Refill: 0 - naproxen  (NAPROSYN ) 500 MG tablet; Take 1 tablet (500 mg total) by mouth 2 (two) times daily with a meal.  Dispense: 20 tablet; Refill: 0  Supportive measures and OTC medications reviewed - needs to continue good oral hygiene, avoid chewing on the affected side, and continue OTC Tylenol . Augmentin  and Naprosyn  per orders. Dental resources provided. Strict in-person follow-up precautions reviewed.  Follow Up Instructions: I discussed the assessment and treatment plan with the patient. The patient was provided an opportunity to ask questions and all were answered. The patient agreed with the plan and demonstrated an understanding of the instructions.  A copy of instructions were sent to the patient via MyChart unless otherwise noted below.   The patient was advised to call back or seek an in-person evaluation if the symptoms worsen or if the condition fails to improve as anticipated.    Larry Velma Lunger, PA-C

## 2024-05-09 ENCOUNTER — Encounter: Payer: Self-pay | Admitting: Family Medicine

## 2024-05-19 ENCOUNTER — Ambulatory Visit (INDEPENDENT_AMBULATORY_CARE_PROVIDER_SITE_OTHER): Payer: Self-pay | Admitting: Family Medicine

## 2024-05-19 VITALS — BP 125/82 | HR 87 | Ht 72.0 in | Wt 274.0 lb

## 2024-05-19 DIAGNOSIS — R350 Frequency of micturition: Secondary | ICD-10-CM | POA: Insufficient documentation

## 2024-05-19 DIAGNOSIS — I10 Essential (primary) hypertension: Secondary | ICD-10-CM

## 2024-05-19 DIAGNOSIS — Z021 Encounter for pre-employment examination: Secondary | ICD-10-CM | POA: Diagnosis not present

## 2024-05-19 DIAGNOSIS — E782 Mixed hyperlipidemia: Secondary | ICD-10-CM

## 2024-05-19 LAB — POCT URINALYSIS DIP (CLINITEK)
Bilirubin, UA: NEGATIVE
Blood, UA: NEGATIVE
Glucose, UA: NEGATIVE mg/dL
Ketones, POC UA: NEGATIVE mg/dL
Leukocytes, UA: NEGATIVE
Nitrite, UA: NEGATIVE
POC PROTEIN,UA: NEGATIVE
Spec Grav, UA: 1.02 (ref 1.010–1.025)
Urobilinogen, UA: 1 U/dL
pH, UA: 7 (ref 5.0–8.0)

## 2024-05-19 LAB — POCT GLYCOSYLATED HEMOGLOBIN (HGB A1C): HbA1c POC (<> result, manual entry): 5.5 % (ref 4.0–5.6)

## 2024-05-19 MED ORDER — DOXAZOSIN MESYLATE 4 MG PO TABS: 4.0000 mg | ORAL_TABLET | Freq: Every day | ORAL | 3 refills | Status: AC

## 2024-05-19 NOTE — Progress Notes (Unsigned)
 Acute Office Visit  Subjective:     Patient ID: Larry Phillips, male    DOB: 28-Feb-1976, 48 y.o.   MRN: [id number]  No chief complaint on file.   HPI Patient is in today for   Subjective - Waking 2-3 times per night to urinate for the past year. Reports this was an issue even before increasing water intake. - Previously urinated infrequently during the day, maybe once. Now urinating more frequently during the day after increasing water intake. - Reports a weak urinary stream that has since strengthened with increased hydration. - Denies pain, burning, or tingling with urination. - Drinking approximately 89 oz of water daily. Also drinks coffee and occasionally dandelion tea. Stopped drinking other teas as he felt they weakened his stream. - Last fluid intake is around 8-9 PM, goes to bed at 10 PM. - Reports a history of daily marijuana use, with last use on 03/25/2024. 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 - Continues to test positive on home urine drug screens for THC, approximately 56 days after cessation. Denies any ongoing exposure. 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 - Recent abscess a few weeks ago, treated with a prescribed antibiotic and anti-inflammatory medication.  Medications Currently taking amlodipine  and losartan  daily. Naproxen  as needed. Sildenafil  as needed.  PMH, PSH, FH, Social Hx PMHx: Hypertension, BPH (presumed), abscess (resolved). Social Hx: Reports history of daily marijuana use, stopped 03/25/2024. Denies current use of tobacco, vape, or other illicit substances. Works in a field requiring a CDL.  ROS Urinary: Reports nocturia and urinary frequency. Denies dysuria, burning, or tingling. Reports a previously weak stream that has improved. Constitutional: Denies significant weight loss, reports recent weight gain.   ROS      Objective:     BP 125/82   Pulse 87   Ht 6' (1.829 m)   Wt 274 lb (124.3 kg)   SpO2 100%   BMI 37.16 kg/m  {Vitals History (Optional):23777}  Physical Exam Gen: alert, oriented Pulm: no respiratory distress Psych: pleasant affect  Results for orders placed or performed in visit on 05/19/24  POCT glycosylated hemoglobin (Hb A1C)  Result Value Ref Range   Hemoglobin A1C     HbA1c POC (<> result, manual entry) 5.5 4.0 - 5.6 %   HbA1c, POC (prediabetic range)     HbA1c, POC (controlled diabetic range)    POCT URINALYSIS DIP (CLINITEK)  Result Value Ref Range   Color, UA yellow yellow   Clarity, UA clear clear   Glucose, UA negative negative mg/dL   Bilirubin, UA negative negative   Ketones, POC UA negative negative mg/dL   Spec Grav, UA 8.979 8.989 - 1.025   Blood, UA negative negative   pH, UA 7.0 5.0 - 8.0   POC PROTEIN,UA negative negative, trace   Urobilinogen, UA 1.0 0.2 or 1.0 E.U./dL   Nitrite, UA Negative Negative   Leukocytes, UA Negative Negative        Assessment & Plan:   Urinary frequency Assessment & Plan: - Likely multifactorial, secondary to high fluid intake and probable Benign Prostatic Hyperplasia (BPH) given age and symptoms. Nocturia was present prior to increasing fluid intake. Stream characteristics have changed. Denies dysuria. Urinalysis and blood sugar are normal, making infection and diabetes less likely. - Start cardura 4mg  once daily in the morning. - Ordered labs including PSA. - Follow up in 1  month to review results and assess response to treatment.  Orders: -     Comprehensive metabolic panel with GFR -     PSA -     POCT URINALYSIS DIP (CLINITEK)  Mixed hyperlipidemia -     Comprehensive metabolic panel with GFR -     Lipid panel -     POCT glycosylated hemoglobin (Hb A1C)  Essential hypertension Assessment & Plan:  Continue current regimen of losartan  and amlodipine . - cardura may also slightly lower blood pressure.  Orders: -     CBC  with Differential/Platelet -     POCT glycosylated hemoglobin (Hb A1C)  Other orders -     Doxazosin Mesylate; Take 1 tablet (4 mg total) by mouth daily.  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  No signs of ongoing use reported. - Ordered a quantitative urine drug screen to be sent to [facility] to confirm and get a more accurate reading than a dipstick. - Discussed possibility of hair follicle testing if urine tests remain positive after 90 days. Acknowledged that a hair test now may be positive.     Return in about 4 weeks (around 06/16/2024) for HTN, hld.  [name], MD

## 2024-05-19 NOTE — Assessment & Plan Note (Signed)
-   Likely multifactorial, secondary to high fluid intake and probable Benign Prostatic Hyperplasia (BPH) given age and symptoms. Nocturia was present prior to increasing fluid intake. Stream characteristics have changed. Denies dysuria. Urinalysis and blood sugar are normal, making infection and diabetes less likely. - Start cardura 4mg  once daily in the morning. - Ordered labs including PSA. - Follow up in 1 month to review results and assess response to treatment.

## 2024-05-19 NOTE — Assessment & Plan Note (Signed)
 Continue current regimen of losartan  and amlodipine . - cardura may also slightly lower blood pressure.

## 2024-05-19 NOTE — Patient Instructions (Signed)
 It was nice to see you today,  We addressed the following topics today: - Please try to limit your fluid intake to about 64 ounces per day. - I would like you to stop drinking all fluids at least 3 hours before you go to bed. For example, if you go to bed at 10 PM, stop drinking anything after 7 PM. - Please start taking the new medication, cardura, once every day in the morning. - Continue your exercise routine, including walking and weight lifting. - I would like you to return for a follow-up appointment in one month. Please schedule this at the front desk.  Have a great day,  Rolan Slain, MD

## 2024-05-26 ENCOUNTER — Other Ambulatory Visit

## 2024-05-26 DIAGNOSIS — Z021 Encounter for pre-employment examination: Secondary | ICD-10-CM

## 2024-06-19 ENCOUNTER — Encounter: Payer: Self-pay | Admitting: Family Medicine

## 2024-06-19 ENCOUNTER — Ambulatory Visit: Admitting: Family Medicine

## 2024-06-19 VITALS — BP 117/77 | HR 84 | Temp 98.0°F | Ht 72.0 in | Wt 275.0 lb

## 2024-06-19 DIAGNOSIS — R350 Frequency of micturition: Secondary | ICD-10-CM | POA: Diagnosis not present

## 2024-06-19 DIAGNOSIS — I10 Essential (primary) hypertension: Secondary | ICD-10-CM | POA: Diagnosis not present

## 2024-06-19 DIAGNOSIS — Z021 Encounter for pre-employment examination: Secondary | ICD-10-CM | POA: Diagnosis not present

## 2024-06-19 DIAGNOSIS — E782 Mixed hyperlipidemia: Secondary | ICD-10-CM | POA: Diagnosis not present

## 2024-06-19 NOTE — Assessment & Plan Note (Signed)
-   Patient is concerned about a faint line on home urine drug tests 84 days after cessation of long-term use. 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 - Urine sample sent for marijuana metabolite test. - Will follow up on results. - RTC in 6 weeks for follow-up on medication changes and lab results.

## 2024-06-19 NOTE — Progress Notes (Unsigned)
 Established Patient Office Visit  Subjective   Patient ID: Larry Phillips, male    DOB: 11/12/1975  Age: 48 y.o. MRN: [id number]  Chief Complaint  Patient presents with   Dysuria    HPI  Subjective - Follow-up for urinary symptoms. Reports continued nocturia, waking 2-3 times per night to urinate. Urinates maybe once during the day. Reports that after starting medication, the duration of urination has increased from a couple of seconds to 5-6 seconds. Reports feeling of complete bladder emptying after urination. Denies pain with urination. Reports stopping fluid intake around 7 PM as advised and has slightly decreased overall water intake. - Follow-up for drug screening. Is taking home drug tests which show a faint second line for marijuana, causing concern. 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 Reports a long history of marijuana use, starting as a teenager, but stopped at least 84 days ago for a life change. Is trying to detox with super greens and antioxidants.  Medications Cardura (doxazosin) taken in the morning, Losartan  taken around 5 or 6 PM, Amlodipine .  PMH, PSH, FH, Social Hx PMHx: High blood pressure, diagnosed a couple of years ago, around the time nocturia started. Denies history of gout. Social Hx: Reports a history of smoking marijuana for most of his life, since he was a teenager. States he quit 84 days ago.  ROS Genitourinary: Reports nocturia (2-3x/night), decreased daytime urinary frequency (1x/day). Reports improved urinary stream and sensation of complete bladder emptying. Denies dysuria.   The 10-year ASCVD risk score (Arnett DK, et al., 2019) is: 6.7%  Health Maintenance Due  Topic Date Due   DTaP/Tdap/Td (1 - Tdap) Never done   Hepatitis B Vaccines 19-59 Average Risk (1 of 3 - 19+ 3-dose series) Never done   COVID-19 Vaccine (1 - 2025-26 season) Never done      Objective:     BP 117/77   Pulse 84   Temp 98 F (36.7  C) (Oral)   Ht 6' (1.829 m)   Wt 275 lb 0.6 oz (124.8 kg)   SpO2 98%   BMI 37.30 kg/m    Physical Exam Gen: alert, oriented Pulm: no respiratory distress Psych: pleasant affect   No results found for any visits on 06/19/24.      Assessment & Plan:   Drug screening, pre-employment Assessment & Plan: - Patient is concerned about a faint line on home urine drug tests 84 days after cessation of long-term use. 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 - Urine sample sent for marijuana metabolite test. - Will follow up on results. - RTC in 6 weeks for follow-up on medication changes and lab results.   Urinary frequency Assessment & Plan: - Reports continued nocturia (2-3x/night) with infrequent daytime urination despite starting doxazosin and reducing evening fluid intake. Stream has improved. Blood pressure is well-controlled on current regimen. The cause is likely multifactorial, including BPH and bladder training patterns. - Increase doxazosin from 4mg  to 8mg  daily. Trial taking two 4mg  tablets daily. - Continue to limit fluid intake after 7 PM. - Discussed possibility of adding a diuretic like hydrochlorothiazide in the morning to encourage daytime urination if symptoms persist, but will hold off for now as blood pressure is controlled. - Discussed possibility of a medication to reduce nighttime urination to retrain the bladder, but will defer this. - Schedule blood draw for labs, including a PSA test.      Return in about 6 weeks (around 07/31/2024)  for urinary freq, uds.    [name], MD

## 2024-06-19 NOTE — Assessment & Plan Note (Signed)
-   Reports continued nocturia (2-3x/night) with infrequent daytime urination despite starting doxazosin and reducing evening fluid intake. Stream has improved. Blood pressure is well-controlled on current regimen. The cause is likely multifactorial, including BPH and bladder training patterns. - Increase doxazosin from 4mg  to 8mg  daily. Trial taking two 4mg  tablets daily. - Continue to limit fluid intake after 7 PM. - Discussed possibility of adding a diuretic like hydrochlorothiazide in the morning to encourage daytime urination if symptoms persist, but will hold off for now as blood pressure is controlled. - Discussed possibility of a medication to reduce nighttime urination to retrain the bladder, but will defer this. - Schedule blood draw for labs, including a PSA test.

## 2024-06-19 NOTE — Patient Instructions (Signed)
 It was nice to see you today,  We addressed the following topics today: - I would like you to try taking two of your doxazosin 4mg  tablets in the morning. - If you have any issues with taking two tablets, such as dizziness, you can try taking one and a half tablets instead. You can check your blood pressure at home. - Please schedule an appointment for a blood draw for your labs.  - I will see you again in about six weeks to follow up. - We will contact you with the results of the urine drug test. 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  Have a great day,  Rolan Slain, MD

## 2024-06-20 NOTE — Addendum Note (Signed)
 Addended by: CHANDRA TORIBIO POUR on: 06/20/2024 08:15 AM   Modules accepted: Orders

## 2024-06-21 LAB — COMPREHENSIVE METABOLIC PANEL WITH GFR
AG Ratio: 1.6 (calc) (ref 1.0–2.5)
ALT: 12 U/L (ref 9–46)
AST: 15 U/L (ref 10–40)
Albumin: 4.3 g/dL (ref 3.6–5.1)
Alkaline phosphatase (APISO): 64 U/L (ref 36–130)
BUN: 7 mg/dL (ref 7–25)
CO2: 27 mmol/L (ref 20–32)
Calcium: 9.5 mg/dL (ref 8.6–10.3)
Chloride: 104 mmol/L (ref 98–110)
Creat: 1.08 mg/dL (ref 0.60–1.29)
Globulin: 2.7 g/dL (ref 1.9–3.7)
Glucose, Bld: 86 mg/dL (ref 65–139)
Potassium: 4.1 mmol/L (ref 3.5–5.3)
Sodium: 140 mmol/L (ref 135–146)
Total Bilirubin: 1 mg/dL (ref 0.2–1.2)
Total Protein: 7 g/dL (ref 6.1–8.1)
eGFR: 86 mL/min/1.73m2 (ref >=60)

## 2024-06-21 LAB — PSA: PSA: 0.81 ng/mL (ref <=4.00)

## 2024-06-21 LAB — CBC WITH DIFFERENTIAL/PLATELET
Absolute Lymphocytes: 2788 {cells}/uL (ref 850–3900)
Absolute Monocytes: 435 {cells}/uL (ref 200–950)
Basophils Absolute: 37 {cells}/uL (ref 0–200)
Basophils Relative: 0.7 %
Eosinophils Absolute: 101 {cells}/uL (ref 15–500)
Eosinophils Relative: 1.9 %
HCT: 41.7 % (ref 38.5–50.0)
Hemoglobin: 14.1 g/dL (ref 13.2–17.1)
MCH: 31.4 pg (ref 27.0–33.0)
MCHC: 33.8 g/dL (ref 32.0–36.0)
MCV: 92.9 fL (ref 80.0–100.0)
MPV: 9.9 fL (ref 7.5–12.5)
Monocytes Relative: 8.2 %
Neutro Abs: 1940 {cells}/uL (ref 1500–7800)
Neutrophils Relative %: 36.6 %
Platelets: 283 Thousand/uL (ref 140–400)
RBC: 4.49 Million/uL (ref 4.20–5.80)
RDW: 12.9 % (ref 11.0–15.0)
Total Lymphocyte: 52.6 %
WBC: 5.3 Thousand/uL (ref 3.8–10.8)

## 2024-06-22 ENCOUNTER — Ambulatory Visit: Payer: Self-pay | Admitting: Family Medicine

## 2024-06-22 LAB — DM TEMPLATE

## 2024-06-22 LAB — DRUG MONITOR, MARIJUANAMETAB, QN, URINE: Marijuana Metabolite: NEGATIVE ng/mL (ref <5)

## 2024-06-23 ENCOUNTER — Other Ambulatory Visit

## 2024-07-04 ENCOUNTER — Ambulatory Visit: Admitting: Family Medicine

## 2024-07-17 ENCOUNTER — Encounter: Payer: Self-pay | Admitting: Family Medicine

## 2024-07-25 ENCOUNTER — Other Ambulatory Visit: Payer: Self-pay

## 2024-07-25 DIAGNOSIS — I10 Essential (primary) hypertension: Secondary | ICD-10-CM

## 2024-07-31 ENCOUNTER — Ambulatory Visit: Admitting: Family Medicine
